# Patient Record
Sex: Female | Born: 1955 | Race: White | Hispanic: No | Marital: Married
Health system: Southern US, Community
[De-identification: ages and names within clinical notes are randomized; demographics above are authoritative.]

---

## 2005-01-13 ENCOUNTER — Emergency Department (HOSPITAL_COMMUNITY): Admission: EM | Admit: 2005-01-13 | Discharge: 2005-01-13 | Payer: Self-pay | Admitting: Emergency Medicine
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
## 2019-02-24 ENCOUNTER — Emergency Department (HOSPITAL_COMMUNITY)
Admission: EM | Admit: 2019-02-24 | Discharge: 2019-02-24 | Disposition: A | Discharge status: Home / Self Care | Attending: Emergency Medicine | Admitting: Emergency Medicine

## 2019-02-24 ENCOUNTER — Other Ambulatory Visit: Payer: Self-pay

## 2019-02-24 DIAGNOSIS — R21 Rash and other nonspecific skin eruption: Secondary | ICD-10-CM

## 2019-02-24 DIAGNOSIS — Z79899 Other long term (current) drug therapy: Secondary | ICD-10-CM | POA: Insufficient documentation

## 2019-02-24 DIAGNOSIS — L509 Urticaria, unspecified: Secondary | ICD-10-CM | POA: Diagnosis not present

## 2019-02-24 MED ORDER — DIPHENHYDRAMINE HCL 25 MG PO TABS: 25.0000 mg | ORAL_TABLET | Freq: Four times a day (QID) | ORAL | 0 refills | Status: AC | PRN

## 2019-02-24 MED ORDER — DOXYCYCLINE HYCLATE 100 MG PO CAPS: 100.0000 mg | ORAL_CAPSULE | Freq: Two times a day (BID) | ORAL | 0 refills | Status: AC

## 2019-02-24 MED ORDER — PREDNISONE 20 MG PO TABS
60.0000 mg | ORAL_TABLET | Freq: Once | ORAL | Status: AC
  Administered 2019-02-24: 60 mg via ORAL
  Filled 2019-02-24: qty 3

## 2019-02-24 MED ORDER — PREDNISONE 20 MG PO TABS: ORAL_TABLET | ORAL | 0 refills | Status: AC

## 2019-02-24 MED ORDER — DIPHENHYDRAMINE HCL 25 MG PO CAPS
25.0000 mg | ORAL_CAPSULE | Freq: Once | ORAL | Status: AC
  Administered 2019-02-24: 25 mg via ORAL
  Filled 2019-02-24: qty 1

## 2019-02-24 NOTE — ED Provider Notes (Signed)
[facility] EMERGENCY [facility]MENT Provider Note   CSN: [id number] Arrival date & time: 02/24/19  2140     History   Chief Complaint Chief Complaint  Patient presents with  . Rash    HPI Megan Perry is a 63 y.o. female.     The history is provided by the patient and medical records.  Rash    63 year old female presenting to the ED with rash.  States she primarily lives in [place] but recently relocated to [place] after inheriting a home in mid a[place]  Issues include throat today shopping and started feeling itchy throughout the day.  States this evening while changing clothes she noticed rash on her chest, stomach, and back.  States she called the nurse hotline for her insurance who told her to come to the ED.  She denies any fever, chills, or shortness of breath.  She has not had any changes in soaps, detergents, or other personal care products.  She has no known allergies.  She was started on meloxicam for arthritis in her back about 2 weeks ago.  She believes she has taken this in the past without issue but cannot fully recall.  She did find a tick on her leg about 4 weeks ago so was concerned about possible tickborne illness as well.  States previous tick bites have been treated with antibiotics.  She has not had any abdominal pain, fever, joint pain, headaches.  She was also weeding in her yard yesterday.  No meds PTA.  No past medical history on file.  There are no active problems to display for this patient.    OB History   No obstetric history on file.      Home Medications    Prior to Admission medications   Not on File    Family History No family history on file.  Social History Social History   Tobacco Use  . Smoking status: Not on file  Substance Use Topics  . Alcohol use: Not on file  . Drug use: Not on file     Allergies   Patient has no allergy information on record.   Review of Systems Review of Systems  Skin:  Positive for rash.  All other systems reviewed and are negative.    Physical Exam Updated Vital Signs BP (!) 165/98 (BP Location: Right Arm)   Pulse 65   Temp 97.9 F (36.6 C) (Oral)   Resp 16   SpO2 98%   Physical Exam Vitals signs and nursing note reviewed.  Constitutional:      Appearance: She is well-developed.  HENT:     Head: Normocephalic and atraumatic.     Mouth/Throat:     Comments: No oral lesions, airway clear Eyes:     Conjunctiva/sclera: Conjunctivae normal.     Pupils: Pupils are equal, round, and reactive to light.  Neck:     Musculoskeletal: Normal range of motion.  Cardiovascular:     Rate and Rhythm: Normal rate and regular rhythm.     Heart sounds: Normal heart sounds.  Pulmonary:     Effort: Pulmonary effort is normal.     Breath sounds: Normal breath sounds.     Comments: Lungs clear, no distress, speaking in paragraphs without difficulty Abdominal:     General: Bowel sounds are normal.     Palpations: Abdomen is soft.  Musculoskeletal: Normal range of motion.  Skin:    General: Skin is warm and dry.     Findings:  Rash present. Rash is urticarial.     Comments: Urticarial rash present on the stomach, chest, and back, some signs of excoriation noted but no signs of superimposed infection or cellulitis, there is no drainage, no lesions on the palms or soles No bull's-eye type rash  Neurological:     Mental Status: She is alert and oriented to person, place, and time.      ED Treatments / Results  Labs (all labs ordered are listed, but only abnormal results are displayed) Labs Reviewed - No data to display  EKG    Radiology No results found.  Procedures Procedures (including critical care time)  Medications Ordered in ED Medications  predniSONE (DELTASONE) tablet 60 mg (60 mg Oral Given 02/24/19 2302)  diphenhydrAMINE (BENADRYL) capsule 25 mg (25 mg Oral Given 02/24/19 2301)     Initial Impression / Assessment and Plan / ED Course   I have reviewed the triage vital signs and the nursing notes.  Pertinent labs & imaging results that were available during my care of the patient were reviewed by me and considered in my medical decision making (see chart for details).  63 year old female here with a rash that she noticed today.  She has recently relocated from [place] to [place].  She also reports a tick on her leg about 4 weeks ago but was not treated with antibiotics this go around as previously with tick bites.  She also reports starting meloxicam 2 weeks ago.  No changes in soaps, detergents, or other personal care products.  She is afebrile and nontoxic.  Urticarial rash across the stomach, chest, and back.  She has excoriation noted but no superimposed infection or cellulitis.  She has not had any lesions on the palms or soles, no oral lesions or airway compromise.  She does not have any bull's-eye type rash.  She is not having any fevers, headaches, excessive joint pain, etc. I discussed with patient that this seems more like an allergic type reaction rather than tickborne illness.  Multiple possible etiologies for this including environmental changes from recent move, new medications, or possible outdoor exposure as she was weeding her yard yesterday.  Would recommend treatment with prednisone and Benadryl.  Patient still has some concerns for tickborne illness as she reports she has a neighbor with Lyme disease.  I have given her prescription for doxycycline but do not feel she needs to fill this at this time.  I recommended that she talk with her primary care doctor about her meloxicam as this is possible etiology.  She can return here for any new or acute changes.  Final Clinical Impressions(s) / ED Diagnoses   Final diagnoses:  Rash    ED Discharge Orders         Ordered    predniSONE (DELTASONE) 20 MG tablet     02/24/19 2259    diphenhydrAMINE (BENADRYL) 25 MG tablet  Every 6 hours PRN     02/24/19 2259     doxycycline (VIBRAMYCIN) 100 MG capsule  2 times daily     02/24/19 2302           Larene Pickett, PA-C 02/24/19 2346    Lennice Sites, DO 02/25/19 0006

## 2019-02-24 NOTE — ED Triage Notes (Signed)
Pt reports red rash over her abd, chest and back that she noticed this morning. Denies any new soaps, lotions or detergents. Did start taking meloxicam 2 weeks ago but unsure if it is from the medicine. Pt also reports finding a tick on her 4 weeks ago in which her PCP gave her an abx for.

## 2019-02-24 NOTE — Discharge Instructions (Addendum)
Your rash seems more allergic than tickborne illness.  Take the prednisone and Benadryl as instructed.  You can use topical hydrocortisone cream as well if you like.  Benadryl can make you sleepy so use with caution or take at night time. Hold onto the prescription for doxycycline.  Again, I do not think this is a tickborne illness but keep this on hand should rash change or symptoms evolve. I will talk with your doctor about your meloxicam as this is a possible etiology of rash. Return here for any new or acute changes.

## 2019-02-24 NOTE — ED Notes (Signed)
Patient verbalizes understanding of discharge instructions. Opportunity for questioning and answers were provided. Armband removed by staff, pt discharged from ED.  

## 2020-02-07 ENCOUNTER — Ambulatory Visit: Attending: Sports Medicine | Admitting: Physical Therapy

## 2020-02-07 ENCOUNTER — Other Ambulatory Visit: Payer: Self-pay

## 2020-02-07 ENCOUNTER — Encounter: Payer: Self-pay | Admitting: Physical Therapy

## 2020-02-07 DIAGNOSIS — M25551 Pain in right hip: Secondary | ICD-10-CM

## 2020-02-07 DIAGNOSIS — M6281 Muscle weakness (generalized): Secondary | ICD-10-CM | POA: Diagnosis present

## 2020-02-07 NOTE — Therapy (Signed)
[facility] [address] [place], [place], [address] Phone: [phone]   Fax:  [phone]  Physical Therapy Evaluation  Patient Details  Name: Megan Perry MRN: [id number] Date of Birth: [birth year] Referring Provider (PT): Rennis Petty, MD   Encounter Date: 02/07/2020   PT End of Session - 02/07/20 1312    Visit Number 1    Number of Visits 12    Date for PT Re-Evaluation 03/27/20    PT Start Time 0815    PT Stop Time 0900    PT Time Calculation (min) 45 min    Activity Tolerance Patient tolerated treatment well           History reviewed. No pertinent past medical history.  History reviewed. No pertinent surgical history.  There were no vitals filed for this visit.    Subjective Assessment - 02/07/20 1302    Subjective COVID-19 screening performed upon arrival. Patient arrives to physical therapy with reports of right lateral hip pain that began in March 2021. Patient reported getting an injection in R hip to which it helped improve pain but still with intermittent shooting pain particularly when laying on the R side while sleeping. Patient reports ability to perform ADLs independently and has been performing Pilates exercises daily for about 15 mins but with decreased tension. Patient reports pain at worst as 7/10 and pain at best as 2/10. Patient's goals are to decrease pain, improve movement, and learn exercises to improve R hip strength.    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 Yes    Pain Score 2     Pain Location Hip    Pain Orientation Right    Pain Descriptors / Indicators Shooting    Pain Type Acute pain    Pain Onset More than a month ago    Pain Frequency Intermittent    Aggravating Factors   rolling over on it at night              [facility] PT Assessment - 02/07/20 0001      Assessment   Medical Diagnosis Trochanteric bursitis Right hip    Referring Provider (PT) Rennis Petty, MD    Onset Date/Surgical Date --   March 2021   Next MD Visit March 04, 2020    Prior Therapy no      Precautions   Precautions None      Restrictions   Weight Bearing Restrictions No      Balance Screen   Has the patient fallen in the past 6 months No    Has the patient had a decrease in activity level because of a fear of falling?  No    Is the patient reluctant to leave their home because of a fear of falling?  No      Home Environment   Living Environment Private residence      ROM / Strength   AROM / PROM / Strength AROM;Strength      AROM   AROM Assessment Site Hip    Right/Left Hip Right    Right Hip Flexion 112    Right Hip External Rotation  30    Right Hip Internal Rotation  10    Right Hip ABduction 20    Right Hip  ADduction 15      Strength   Strength Assessment Site Hip;Knee    Right/Left Hip Right    Right Hip Flexion 3-/5    Right Hip Extension 3-/5    Right Hip ABduction 3-/5    Right/Left Knee Right    Right Knee Flexion 4-/5    Right Knee Extension 4-/5      Palpation   Palpation comment tenderness to R greater trochanter, lateral glute, and ITB      Ambulation/Gait   Gait Pattern Step-through pattern;Decreased stance time - right;Decreased weight shift to right;Trendelenburg                      Objective measurements completed on examination: See above findings.               PT Education - 02/07/20 1311    Education Details bridge, clamshell, prone hip extension, piriformis stretch    Person(s) Educated Patient    Methods Explanation;Demonstration;Handout    Comprehension Verbalized understanding               PT Long Term Goals - 02/07/20 1314      PT LONG TERM GOAL #1   Title Patient will be independent with  HEP    Time 6    Period Weeks    Status New      PT LONG TERM GOAL #2   Title Patient will demonstrate 4+/5 or greater right LE MMT to improve stability during functional tasks.    Time 6    Period Weeks    Status New      PT LONG TERM GOAL #3   Title Patient will report ability to return to pilates exercise with no right hip pain.    Time 6    Period Weeks    Status New                  Plan - 02/07/20 1312    Clinical Impression Statement Patient is a 64 year old female who presents to physical therapy with R hip pain, and decreased R LE MMT that began about 3 months ago. Patient very tender to R greater trochanter and R ITB upon palpation; minimal in upper or middles glutes. Patient ambulates with slight R tendelenburg gait, and decreased R stance time, gait deviations more noticeable after end of evaluation. Patient and PT discussed plan of care and HEP to which she reported understanding. Patient would benefit from skilled physical therapy to address deficits and goals.    Stability/Clinical Decision Making Stable/Uncomplicated    Clinical Decision Making Low    Rehab Potential Good    PT Frequency 2x / week    PT Duration 6 weeks    PT Treatment/Interventions ADLs/Self Care Home Management;Cryotherapy;Electrical Stimulation;Iontophoresis 4mg /ml Dexamethasone;Moist Heat;Ultrasound;Gait training;Stair training;Functional mobility training;Therapeutic activities;Therapeutic exercise;Balance training;Neuromuscular re-education;Manual techniques;Passive range of motion;Patient/family education    PT Next Visit Plan nustep or bike, R LE strengthening, in supine then progress to standing. modlaities PRN for pain relief    PT Home Exercise Plan see patient education section    Consulted and Agree with Plan of Care Patient           Patient will benefit from skilled therapeutic intervention in order to improve the following deficits and impairments:  Decreased range of motion,  Decreased activity tolerance, Decreased strength, Pain  Visit Diagnosis: Pain in right hip  Muscle weakness (generalized)     Problem List There are  no problems to display for this patient.   Guss Bunde PT, DPT 02/07/2020, 1:21 PM  [facility] [address] [place], [place], [address] Phone: [phone]   Fax:  [phone]  Name: Megan Perry MRN: [id number] Date of Birth: 03-25-1956

## 2020-02-10 ENCOUNTER — Other Ambulatory Visit: Payer: Self-pay

## 2020-02-10 ENCOUNTER — Ambulatory Visit

## 2020-02-10 DIAGNOSIS — M25551 Pain in right hip: Secondary | ICD-10-CM

## 2020-02-10 DIAGNOSIS — M6281 Muscle weakness (generalized): Secondary | ICD-10-CM

## 2020-02-10 NOTE — Therapy (Signed)
[facility] [place], [place], [address] Phone: [phone]   Fax:  [phone]  Physical Therapy Treatment  Patient Details  Name: Storey Stangeland MRN: [id number] Date of Birth: [birth year] Referring Provider (PT): Janace Aris, MD   Encounter Date: 02/10/2020   PT End of Session - 02/10/20 0919    Visit Number 2    Number of Visits 12    Date for PT Re-Evaluation 03/27/20    PT Start Time 0901    PT Stop Time 0944    PT Time Calculation (min) 43 min    Activity Tolerance Patient tolerated treatment well    Behavior During Therapy [facility] for tasks assessed/performed           History reviewed. No pertinent past medical history.  History reviewed. No pertinent surgical history.  There were no vitals filed for this visit.   Subjective Assessment - 02/10/20 0908    Subjective COVID-19 screening performed upon arrival.  Pt stated her hip is feeling okay today, reports the shot assisted with some pain but feels its coming back.  Has began the exercises at home.    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 Yes    Pain Score 1     Pain Location Hip    Pain Orientation Right    Pain Descriptors / Indicators Aching    Pain Type Acute pain    Pain Radiating Towards lateral hip ending mid thigh doesnt reach knee    Pain Onset More than a month ago    Pain Frequency Intermittent    Aggravating Factors  sitting, rolling over on it at night    Pain Relieving Factors standing and walking                             [facility] Adult PT Treatment/Exercise - 02/10/20 0001      Exercises   Exercises Knee/Hip      Knee/Hip Exercises: Stretches   Piriformis Stretch Both;3 reps;30 seconds    Piriformis Stretch Limitations figure 4      Knee/Hip Exercises: Supine   Bridges 10 reps    Bridges Limitations 3" holds      Knee/Hip Exercises: Sidelying   Clams BLE  10x 3" GTB  around  thigh, cueing  not to roll back      Knee/Hip Exercises: Prone   Hip Extension Both;10 reps      Manual Therapy   Manual Therapy Soft tissue mobilization    Manual therapy comments Manual complete separate than rest of tx    Soft tissue mobilization Lt sidelying to Rt gluteal region                       PT Long Term Goals - 02/07/20 1314      PT LONG TERM GOAL #1   Title Patient will be independent with HEP    Time 6    Period Weeks    Status New      PT LONG TERM GOAL #2   Title Patient will demonstrate 4+/5 or greater right LE MMT to improve stability during functional tasks.    Time 6    Period Weeks    Status New      PT LONG TERM GOAL #3   Title Patient will report ability to return to pilates exercise with no right hip pain.  Time 6    Period Weeks    Status New                 Plan - 02/10/20 0930    Clinical Impression Statement Reviewed goals, educated importance of HEP compliance and assured correct form/mechanics with current home exercise program.  Pt able to recall and demonstrate appropriate mechanics iwht minimal cueing for form, able to complete all exercises with no reports of pain.  Modified piriformis stretch with reports of better stretch.  Manual STM complete Rt hip with tenderness with palpation, reports of relief following.    Stability/Clinical Decision Making Stable/Uncomplicated    Clinical Decision Making Low    Rehab Potential Good    PT Frequency 2x / week    PT Duration 6 weeks    PT Treatment/Interventions ADLs/Self Care Home Management;Cryotherapy;Electrical Stimulation;Iontophoresis 4mg /ml Dexamethasone;Moist Heat;Ultrasound;Gait training;Stair training;Functional mobility training;Therapeutic activities;Therapeutic exercise;Balance training;Neuromuscular re-education;Manual techniques;Passive range of motion;Patient/family education    PT Next Visit Plan nustep or bike, R LE strengthening, in supine then  progress to standing. modlaities PRN for pain relief    PT Home Exercise Plan see patient education section           Patient will benefit from skilled therapeutic intervention in order to improve the following deficits and impairments:  Decreased range of motion, Decreased activity tolerance, Decreased strength, Pain  Visit Diagnosis: Muscle weakness (generalized)  Pain in right hip     Problem List There are no problems to display for this patient.  , LPTA/CLT; CBIS [phone]' [phone] 02/10/2020, 1:05 PM  [facility] [address] [place], [place], [place] Phone: [phone]   Fax:  [phone]  Name: Shakaya Bhullar MRN: Herbert Moors Date of Birth: 08/03/1956

## 2020-02-10 NOTE — Patient Instructions (Signed)
Piriformis Stretch, Supine    Lie supine, one ankle crossed onto opposite knee. Holding bottom leg behind knee, gently pull legs toward chest until stretch is felt in buttock of top leg. Hold 30 seconds. For deeper stretch gently push top knee away from body.  Repeat 3 times per session. Do 2 sessions per day.  Copyright  VHI. All rights reserved.   

## 2020-02-14 ENCOUNTER — Other Ambulatory Visit: Payer: Self-pay

## 2020-02-14 ENCOUNTER — Ambulatory Visit: Admitting: Physical Therapy

## 2020-02-14 ENCOUNTER — Encounter: Payer: Self-pay | Admitting: Physical Therapy

## 2020-02-14 DIAGNOSIS — M25551 Pain in right hip: Secondary | ICD-10-CM

## 2020-02-14 DIAGNOSIS — M6281 Muscle weakness (generalized): Secondary | ICD-10-CM

## 2020-02-14 NOTE — Therapy (Signed)
[facility] [place], [place], [address] Phone: [phone]   Fax:  [phone]  Physical Therapy Treatment  Patient Details  Name: Tanessa Tidd MRN: [id number] Date of Birth: 08/20/1956 Referring Provider (PT): Janace Aris, MD   Encounter Date: 02/14/2020   PT End of Session - 02/14/20 0908    Visit Number 3    Number of Visits 12    Date for PT Re-Evaluation 03/27/20    PT Start Time 0902    PT Stop Time 0945    PT Time Calculation (min) 43 min    Activity Tolerance Patient tolerated treatment well    Behavior During Therapy [facility] for tasks assessed/performed           History reviewed. No pertinent past medical history.  History reviewed. No pertinent surgical history.  There were no vitals filed for this visit.   Subjective Assessment - 02/14/20 0907    Subjective COVID 19 screening performed on patient upon arrival. 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 No/denies              [facility] PT Assessment - 02/14/20 0001      Assessment   Medical Diagnosis Trochanteric bursitis Right hip    Referring Provider (PT) Janace Aris, MD    Next MD Visit March 04, 2020    Prior Therapy no      Precautions   Precautions None      Restrictions   Weight Bearing Restrictions No                         [facility] Adult PT Treatment/Exercise - 02/14/20 0001      Knee/Hip Exercises: Stretches   Passive Hamstring Stretch Right;3 reps;30 seconds    ITB Stretch Right;3 reps;30 seconds    Piriformis Stretch Right;3 reps;30 seconds    Piriformis Stretch Limitations figure 4      Knee/Hip Exercises: Aerobic    Nustep L3 x10 min      Knee/Hip Exercises: Supine   Hip Adduction Isometric Strengthening;Both;20 reps    Bridges with Clamshell Strengthening;Both;20 reps;Limitations   red theraband   Other Supine Knee/Hip Exercises B hip clam red theraband x20 rep      Knee/Hip Exercises: Sidelying   Clams RLE in L SL x20 reps      Manual Therapy   Manual Therapy Soft tissue mobilization    Soft tissue mobilization STW/TPR to R ITB, glute med to reduce TPs and resulting muscle tightness                       PT Long Term Goals - 02/07/20 1314      PT LONG TERM GOAL #1   Title Patient will be independent with HEP    Time 6    Period Weeks    Status New      PT LONG TERM GOAL #2   Title Patient will demonstrate 4+/5 or greater right LE MMT to improve stability during functional tasks.    Time 6    Period Weeks    Status New  PT LONG TERM GOAL #3   Title Patient will report ability to return to pilates exercise with no right hip pain.    Time 6    Period Weeks    Status New                 Plan - 02/14/20 1023    Clinical Impression Statement Patient presented in clinic with reports of pain initially upon waking this morning. Patient remains faithful to pilates and HEP provided in PT. Chronic R knee pain somewhat limited PT session with figure 4 stretching. Patient notes that during activities pain is not bothersome but afterwards she experiences increased pain. TPs noted in R ITB region.    Stability/Clinical Decision Making Stable/Uncomplicated    Rehab Potential Good    PT Frequency 2x / week    PT Duration 6 weeks    PT Treatment/Interventions ADLs/Self Care Home Management;Cryotherapy;Electrical Stimulation;Iontophoresis 4mg /ml Dexamethasone;Moist Heat;Ultrasound;Gait training;Stair training;Functional mobility training;Therapeutic activities;Therapeutic exercise;Balance training;Neuromuscular re-education;Manual techniques;Passive range of  motion;Patient/family education    PT Next Visit Plan nustep or bike, R LE strengthening, in supine then progress to standing. modlaities PRN for pain relief    PT Home Exercise Plan see patient education section    Consulted and Agree with Plan of Care Patient           Patient will benefit from skilled therapeutic intervention in order to improve the following deficits and impairments:  Decreased range of motion, Decreased activity tolerance, Decreased strength, Pain  Visit Diagnosis: Muscle weakness (generalized)  Pain in right hip     Problem List There are no problems to display for this patient.   , PTA 02/14/2020, 11:28 AM  [facility] [address] [place], [place], [place] Phone: [phone]   Fax:  [phone]  Name: Vail Basista MRN: Herbert Moors Date of Birth: [birth year]

## 2020-02-17 ENCOUNTER — Ambulatory Visit: Admitting: Physical Therapy

## 2020-02-17 ENCOUNTER — Other Ambulatory Visit: Payer: Self-pay

## 2020-02-17 DIAGNOSIS — M25551 Pain in right hip: Secondary | ICD-10-CM | POA: Diagnosis not present

## 2020-02-17 DIAGNOSIS — M6281 Muscle weakness (generalized): Secondary | ICD-10-CM

## 2020-02-17 NOTE — Therapy (Signed)
[facility] [place], [place], [address] Phone: [phone]   Fax:  [phone]  Physical Therapy Treatment  Patient Details  Name: Megan Perry MRN: [id number] Date of Birth: Jan 21, 1956 Referring Provider (PT): Janace Aris, MD   Encounter Date: 02/17/2020   PT End of Session - 02/17/20 0900    Visit Number 4    Number of Visits 12    Date for PT Re-Evaluation 03/27/20    PT Start Time 0815    PT Stop Time 0859    PT Time Calculation (min) 44 min    Activity Tolerance Patient tolerated treatment well    Behavior During Therapy [facility] for tasks assessed/performed           No past medical history on file.  No past surgical history on file.  There were no vitals filed for this visit.   Subjective Assessment - 02/17/20 0824    Subjective COVID 19 screening performed on patient upon arrival. Patient arrived with less discomfort overall, did well after last treatment.    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 Yes    Pain Score 2     Pain Location Hip    Pain Orientation Right    Pain Descriptors / Indicators Discomfort    Pain Type Acute pain    Pain Onset More than a month ago    Pain Frequency Intermittent    Aggravating Factors  prolong sitting or pressure on hip    Pain Relieving Factors standing / walking                             [facility] Adult PT Treatment/Exercise - 02/17/20 0001      Knee/Hip Exercises: Stretches   ITB Stretch Right;3 reps;20 seconds   standing     Knee/Hip Exercises: Aerobic   Nustep L3 x10 min      Knee/Hip Exercises: Supine   Other Supine Knee/Hip Exercises B hip clam red theraband x20 rep      Modalities   Modalities Ultrasound      Ultrasound    Ultrasound Location rt hip    Ultrasound Parameters 1.5w/cm2/50%/61mhz x59min    Ultrasound Goals Pain      Manual Therapy   Manual Therapy Soft tissue mobilization    Soft tissue mobilization STW/TPR to R ITB, piriformis glute med to reduce TPs and resulting muscle tightness      Prosthetics   Prosthetic Care Comments                          PT Long Term Goals - 02/17/20 1540      PT LONG TERM GOAL #1   Title Patient will be independent with HEP    Time 6    Period Weeks    Status On-going      PT LONG TERM GOAL #2   Title Patient will demonstrate 4+/5 or greater right LE MMT to improve stability during functional tasks.    Time 6    Period Weeks    Status On-going      PT LONG TERM GOAL #3   Title Patient will report ability to return to pilates  exercise with no right hip pain.    Time 6    Period Weeks    Status On-going                 Plan - 02/17/20 0903    Clinical Impression Statement Patient tolerated treatment well today. Patient continues to have some pain in right hip esp with laying on right side or prolong activity. Patient is limited with right knee pain, so no progression on nustep today. Today started [place] to hip followed by manual STW to hip area to reduce pain and tightness. Patient has palpable pain and tightness with manual STW today. Patient current goals progressing.    Stability/Clinical Decision Making Stable/Uncomplicated    Rehab Potential Good    PT Frequency 2x / week    PT Duration 6 weeks    PT Treatment/Interventions ADLs/Self Care Home Management;Cryotherapy;Electrical Stimulation;Iontophoresis 4mg /ml Dexamethasone;Moist Heat;Ultrasound;Gait training;Stair training;Functional mobility training;Therapeutic activities;Therapeutic exercise;Balance training;Neuromuscular re-education;Manual techniques;Passive range of motion;Patient/family education    PT Next Visit Plan assess and cont with R LE strengthening, in supine  then progress to standing. modlaities PRN for pain relief awaiting for ionto approval from MD    Consulted and Agree with Plan of Care Patient           Patient will benefit from skilled therapeutic intervention in order to improve the following deficits and impairments:  Decreased range of motion, Decreased activity tolerance, Decreased strength, Pain  Visit Diagnosis: Muscle weakness (generalized)  Pain in right hip     Problem List There are no problems to display for this patient.   [place], PTA 02/17/2020, 9:14 AM  [facility] [address] [place], [place], [place] Phone: [phone]   Fax:  [phone]  Name: Megan Perry MRN: Herbert Moors Date of Birth: 1956-05-24

## 2020-02-18 NOTE — Addendum Note (Signed)
Addended by: Guss Bunde on: 02/18/2020 03:24 PM   Modules accepted: Orders

## 2020-02-20 ENCOUNTER — Encounter: Payer: Self-pay | Admitting: Physical Therapy

## 2020-02-20 ENCOUNTER — Other Ambulatory Visit: Payer: Self-pay

## 2020-02-20 ENCOUNTER — Ambulatory Visit: Admitting: Physical Therapy

## 2020-02-20 DIAGNOSIS — M25551 Pain in right hip: Secondary | ICD-10-CM | POA: Diagnosis not present

## 2020-02-20 DIAGNOSIS — M6281 Muscle weakness (generalized): Secondary | ICD-10-CM

## 2020-02-20 NOTE — Therapy (Signed)
[facility] [place], [place], [address] Phone: [phone]   Fax:  [phone]  Physical Therapy Treatment  Patient Details  Name: Megan Perry MRN: [id number] Date of Birth: May 02, 1956 Referring Provider (PT): Janace Aris, MD   Encounter Date: 02/20/2020   PT End of Session - 02/20/20 0826    Visit Number 5    Number of Visits 12    Date for PT Re-Evaluation 03/27/20    PT Start Time 0815    PT Stop Time 0856    PT Time Calculation (min) 41 min    Activity Tolerance Patient tolerated treatment well    Behavior During Therapy [facility] for tasks assessed/performed           History reviewed. No pertinent past medical history.  History reviewed. No pertinent surgical history.  There were no vitals filed for this visit.   Subjective Assessment - 02/20/20 0816    Subjective COVID 19 screening performed on patient upon arrival. Patient reported soreness then improvement after last treatment.    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 Yes    Pain Score 2     Pain Location Hip    Pain Orientation Right    Pain Descriptors / Indicators Discomfort    Pain Type Acute pain    Pain Onset More than a month ago    Pain Frequency Intermittent    Aggravating Factors  pressure on hip    Pain Relieving Factors walking/standing                             [facility] Adult PT Treatment/Exercise - 02/20/20 0001      Knee/Hip Exercises: Aerobic   Nustep L3 x10 min      Ultrasound   Ultrasound Location RT hip    Ultrasound Parameters 1.5w/cm2/50%/76mhz x44min    Ultrasound Goals Pain      Manual Therapy   Manual Therapy Soft tissue mobilization    Soft tissue mobilization STW/TPR to R ITB,  piriformis glute med to reduce TPs and resulting muscle tightness                       PT Long Term Goals - 02/17/20 0903      PT LONG TERM GOAL #1   Title Patient will be independent with HEP    Time 6    Period Weeks    Status On-going      PT LONG TERM GOAL #2   Title Patient will demonstrate 4+/5 or greater right LE MMT to improve stability during functional tasks.    Time 6    Period Weeks    Status On-going      PT LONG TERM GOAL #3   Title Patient will report ability to return to pilates exercise with no right hip pain.    Time 6    Period Weeks    Status On-going                 Plan - 02/20/20 0859    Clinical Impression Statement Patient tolerated treatment well today. Patient reported some soreness then relief at end of day last session. Patient reported doing a lot  of yard work yesterday and she is doing HEP stretches and exercises daily. Today focused on gentle activity on nustep followed by [place] and STW to reduce pain and inflammation of hip. Patient continues to have palpable pain in right hip with STW. Goals progressing.    Stability/Clinical Decision Making Stable/Uncomplicated    Rehab Potential Good    PT Frequency 2x / week    PT Duration 6 weeks    PT Treatment/Interventions ADLs/Self Care Home Management;Cryotherapy;Electrical Stimulation;Iontophoresis 4mg /ml Dexamethasone;Moist Heat;Ultrasound;Gait training;Stair training;Functional mobility training;Therapeutic activities;Therapeutic exercise;Balance training;Neuromuscular re-education;Manual techniques;Passive range of motion;Patient/family education    PT Next Visit Plan cont and cont with R LE strengthening, in supine then progress to standing. modlaities PRN for pain relief awaiting for ionto approval from MD    Consulted and Agree with Plan of Care Patient           Patient will benefit from skilled therapeutic intervention in order to improve the following deficits and  impairments:  Decreased range of motion, Decreased activity tolerance, Decreased strength, Pain  Visit Diagnosis: Muscle weakness (generalized)  Pain in right hip     Problem List There are no problems to display for this patient.   [place], PTA 02/20/2020, 9:05 AM  [facility] [address] [place], [place], [place] Phone: [phone]   Fax:  [phone]  Name: Megan Perry MRN: Herbert Moors Date of Birth: [birth year]

## 2020-02-24 ENCOUNTER — Encounter: Payer: Self-pay | Admitting: Physical Therapy

## 2020-02-24 ENCOUNTER — Other Ambulatory Visit: Payer: Self-pay

## 2020-02-24 ENCOUNTER — Ambulatory Visit: Admitting: Physical Therapy

## 2020-02-24 DIAGNOSIS — M25551 Pain in right hip: Secondary | ICD-10-CM | POA: Diagnosis not present

## 2020-02-24 DIAGNOSIS — M6281 Muscle weakness (generalized): Secondary | ICD-10-CM

## 2020-02-24 NOTE — Therapy (Signed)
[facility] [place], [place], [address] Phone: [phone]   Fax:  [phone]  Physical Therapy Treatment  Patient Details  Name: Megan Perry MRN: [id number] Date of Birth: [birth year] Referring Provider (PT): Janace Aris, MD   Encounter Date: 02/24/2020   PT End of Session - 02/24/20 0908    Visit Number 6    Number of Visits 12    Date for PT Re-Evaluation 03/27/20    PT Start Time 0902    PT Stop Time 0946    PT Time Calculation (min) 44 min    Activity Tolerance Patient tolerated treatment well    Behavior During Therapy [facility] for tasks assessed/performed           History reviewed. No pertinent past medical history.  History reviewed. No pertinent surgical history.  There were no vitals filed for this visit.   Subjective Assessment - 02/24/20 0906    Subjective COVID 19 screening performed on patient upon arrival. Patient reports pain is still in hip but much better. Painfree primarily at rest.    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 No/denies              [facility] PT Assessment - 02/24/20 0001      Assessment   Medical Diagnosis Trochanteric bursitis Right hip    Referring Provider (PT) Janace Aris, MD    Next MD Visit 03/04/2020    Prior Therapy no      Precautions   Precautions None      Restrictions   Weight Bearing Restrictions No                         [facility] Adult PT Treatment/Exercise - 02/24/20 0001      Knee/Hip Exercises: Aerobic   Nustep L2 x10 min      Modalities   Modalities Ultrasound      Ultrasound   Ultrasound Location R greater trochanter     Ultrasound Parameters 1.5 w/cm2, 100%, 1 mhz x10 min    Ultrasound Goals Pain      Manual  Therapy   Manual Therapy Soft tissue mobilization    Soft tissue mobilization STW/TPR to R ITB, piriformis glute med to reduce TPs and resulting muscle tightness                       PT Long Term Goals - 02/24/20 0912      PT LONG TERM GOAL #1   Title Patient will be independent with HEP    Time 6    Period Weeks    Status Achieved      PT LONG TERM GOAL #2   Title Patient will demonstrate 4+/5 or greater right LE MMT to improve stability during functional tasks.    Time 6    Period Weeks    Status On-going      PT LONG TERM GOAL #3   Title Patient will report ability to return to pilates exercise with no right hip pain.    Time 6    Period Weeks    Status Achieved                 Plan -  02/24/20 0959    Clinical Impression Statement Patient presented in clinic with only intermittant pain that is mostly during activity. Rest is predominately pain free per patient report. Patient indpendent with HEP and correcting any gait deviations. Patient continues to present with min to mod muscle tightness of the R ITB, glute region. Normal [place] response noted at end of [place] session. No negetive complaints reported by end of treatment session.    Stability/Clinical Decision Making Stable/Uncomplicated    Rehab Potential Good    PT Frequency 2x / week    PT Duration 6 weeks    PT Treatment/Interventions ADLs/Self Care Home Management;Cryotherapy;Electrical Stimulation;Iontophoresis 4mg /ml Dexamethasone;Moist Heat;Ultrasound;Gait training;Stair training;Functional mobility training;Therapeutic activities;Therapeutic exercise;Balance training;Neuromuscular re-education;Manual techniques;Passive range of motion;Patient/family education    PT Next Visit Plan cont and cont with R LE strengthening, in supine then progress to standing. modlaities PRN for pain relief awaiting for ionto approval from MD    PT Home Exercise Plan see patient education section    Consulted and Agree  with Plan of Care Patient           Patient will benefit from skilled therapeutic intervention in order to improve the following deficits and impairments:  Decreased range of motion, Decreased activity tolerance, Decreased strength, Pain  Visit Diagnosis: Muscle weakness (generalized)  Pain in right hip     Problem List There are no problems to display for this patient.   [place], PTA 02/24/2020, 10:02 AM  [facility] [address] [place], [place], [place] Phone: [phone]   Fax:  [phone]  Name: Cleta Heatley MRN: Herbert Moors Date of Birth: Jul 21, 1956

## 2020-02-28 ENCOUNTER — Ambulatory Visit: Attending: Sports Medicine | Admitting: Physical Therapy

## 2020-02-28 ENCOUNTER — Encounter: Payer: Self-pay | Admitting: Physical Therapy

## 2020-02-28 ENCOUNTER — Other Ambulatory Visit: Payer: Self-pay

## 2020-02-28 DIAGNOSIS — M6281 Muscle weakness (generalized): Secondary | ICD-10-CM | POA: Diagnosis not present

## 2020-02-28 DIAGNOSIS — M25551 Pain in right hip: Secondary | ICD-10-CM

## 2020-02-28 NOTE — Therapy (Signed)
[facility] [address] [place], [place], [address] Phone: [phone]   Fax:  [phone]  Physical Therapy Treatment  Patient Details  Name: Megan Perry MRN: [id number] Date of Birth: [birth year] Referring Provider (PT): Rennis Petty, MD   Encounter Date: 02/28/2020   PT End of Session - 02/28/20 1111    Visit Number 7    Number of Visits 12    Date for PT Re-Evaluation 03/27/20    PT Start Time 0902    PT Stop Time 0945    PT Time Calculation (min) 43 min    Activity Tolerance Patient tolerated treatment well    Behavior During Therapy [facility] for tasks assessed/performed           History reviewed. No pertinent past medical history.  History reviewed. No pertinent surgical history.  There were no vitals filed for this visit.   Subjective Assessment - 02/28/20 0909    Subjective COVID 19 screening performed on patient upon arrival. Patient reports that her hip is pretty good. Goes back for knee injection on 03/04/2020.    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 Other (Comment)   No pain assessment provided by patient             [facility] PT Assessment - 02/28/20 0001      Assessment   Medical Diagnosis Trochanteric bursitis Right hip    Referring Provider (PT) Rennis Petty, MD    Next MD Visit 03/04/2020    Prior Therapy no      Precautions   Precautions None      Restrictions   Weight Bearing Restrictions No                         [facility] Adult PT Treatment/Exercise - 02/28/20 0001      Knee/Hip Exercises: Aerobic   Nustep L4 x10 min      Modalities   Modalities Ultrasound      Ultrasound   Ultrasound Location R greater trochanter    Ultrasound Parameters Combo 1.5 w/cm2, 100%, 1  mhz x10 min    Ultrasound Goals Pain      Manual Therapy   Manual Therapy Soft tissue mobilization    Soft tissue mobilization STW/TPR to R ITB, piriformis glute med to reduce TPs and resulting muscle tightness                       PT Long Term Goals - 02/24/20 0912      PT LONG TERM GOAL #1   Title Patient will be independent with HEP    Time 6    Period Weeks    Status Achieved      PT LONG TERM GOAL #2   Title Patient will demonstrate 4+/5 or greater right LE MMT to improve stability during functional tasks.    Time 6    Period Weeks    Status On-going      PT LONG TERM GOAL #3   Title Patient will report ability to return to pilates exercise with no right hip pain.    Time 6    Period Weeks    Status Achieved  Plan - 02/28/20 1114    Clinical Impression Statement Patient presented in clinic with reports of some improvement of R hip pain but does have pain following activity while at rest. Patient compliant with icing and HEP as well as still being active with pilates. Patient reports 2-3/10 R hip pain with ADLs. Normal modalities response noted following removal of the modalities.    Stability/Clinical Decision Making Stable/Uncomplicated    Rehab Potential Good    PT Frequency 2x / week    PT Duration 6 weeks    PT Treatment/Interventions ADLs/Self Care Home Management;Cryotherapy;Electrical Stimulation;Iontophoresis 4mg /ml Dexamethasone;Moist Heat;Ultrasound;Gait training;Stair training;Functional mobility training;Therapeutic activities;Therapeutic exercise;Balance training;Neuromuscular re-education;Manual techniques;Passive range of motion;Patient/family education    PT Next Visit Plan cont and cont with R LE strengthening, in supine then progress to standing. modlaities PRN for pain relief awaiting for ionto approval from MD    PT Home Exercise Plan see patient education section    Consulted and Agree with Plan of Care Patient             Patient will benefit from skilled therapeutic intervention in order to improve the following deficits and impairments:  Decreased range of motion, Decreased activity tolerance, Decreased strength, Pain  Visit Diagnosis: Muscle weakness (generalized)  Pain in right hip     Problem List There are no problems to display for this patient.  Megan Perry 02/28/20 11:36 AM   [facility] [address] [place], [place], [place] Phone: [phone]   Fax:  [phone]  Name: [name] MRN: Herbert Moors Date of Birth: 1956-02-21

## 2020-03-17 ENCOUNTER — Ambulatory Visit: Admitting: Physical Therapy

## 2020-03-17 ENCOUNTER — Encounter: Payer: Self-pay | Admitting: Physical Therapy

## 2020-03-17 ENCOUNTER — Other Ambulatory Visit: Payer: Self-pay

## 2020-03-17 DIAGNOSIS — M6281 Muscle weakness (generalized): Secondary | ICD-10-CM

## 2020-03-17 DIAGNOSIS — M25551 Pain in right hip: Secondary | ICD-10-CM

## 2020-03-17 NOTE — Therapy (Signed)
[facility] [place], [place], [address] Phone: [phone]   Fax:  [phone]  Physical Therapy Treatment  PHYSICAL THERAPY DISCHARGE SUMMARY  Visits from Start of Care: 8  Current functional level related to goals / functional outcomes: See below   Remaining deficits: See goals   Education / Equipment: HEP Plan: Patient agrees to discharge.  Patient goals were met. Patient is being discharged due to meeting the stated rehab goals.  ?????  Gabriela Eves, PT, DPT   Patient Details  Name: Megan Perry MRN: [id number] Date of Birth: 1955-10-14 Referring Provider (PT): Janace Aris, MD   Encounter Date: 03/17/2020   PT End of Session - 03/17/20 0934    Visit Number 8    Number of Visits 12    Date for PT Re-Evaluation 03/27/20    PT Start Time 0900    PT Stop Time 0920    PT Time Calculation (min) 20 min    Activity Tolerance Patient tolerated treatment well    Behavior During Therapy [facility] for tasks assessed/performed           History reviewed. No pertinent past medical history.  History reviewed. No pertinent surgical history.  There were no vitals filed for this visit.   Subjective Assessment - 03/17/20 0933    Subjective COVID 19 screening performed on patient upon arrival. Patient reports no pain in the hip, and her goal of 0/10 hip pain has been achieved. Had an injection in her knee recently.    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 No/denies                                     PT Education - 03/17/20 0943    Education Details clamshell, bridge with clamshell, and bridge with pillow squeeze    Person(s) Educated Patient    Methods Explanation;Handout     Comprehension Verbalized understanding               PT Long Term Goals - 03/17/20 0941      PT LONG TERM GOAL #1   Title Patient will be independent with HEP    Time 6    Period Weeks    Status Achieved      PT LONG TERM GOAL #2   Title Patient will demonstrate 4+/5 or greater right LE MMT to improve stability during functional tasks.    Time 6    Period Weeks    Status Achieved      PT LONG TERM GOAL #3   Title Patient will report ability to return to pilates exercise with no right hip pain.    Time 6    Period Weeks    Status Achieved                 Plan - 03/17/20 0934    Clinical Impression Statement Patient presented to the clinic with no pain in the right hip. Attempted warm up on the nustep but wanted to discuss plan of care. Patient discussed she has achieved her goal of no pain in right hip. Patient and PT discussed DC today with emphasis on HEP. Patient inquired if  utilizing an elliptical would aggravate her hip as her neighbor is selling hers and would like to buy it. Patient educated to trial it for her response before purachasing as it has been a couple of years since she has used an elliptical. Patient and PT discussed advanced HEP and was provided with a handout and red and green therabands for progression. Patient also inquired about physical therapy for her knee more so for precaution and patient educated a referral would be needed. Patient reported understanding. Patient DC'd today with all goals met.    Stability/Clinical Decision Making Stable/Uncomplicated    Clinical Decision Making Low    Rehab Potential Good    PT Frequency 2x / week    PT Duration 6 weeks    PT Treatment/Interventions ADLs/Self Care Home Management;Cryotherapy;Electrical Stimulation;Iontophoresis 80m/ml Dexamethasone;Moist Heat;Ultrasound;Gait training;Stair training;Functional mobility training;Therapeutic activities;Therapeutic exercise;Balance training;Neuromuscular  re-education;Manual techniques;Passive range of motion;Patient/family education    PT Next Visit Plan DC    PT Home Exercise Plan see patient education section    Consulted and Agree with Plan of Care Patient           Patient will benefit from skilled therapeutic intervention in order to improve the following deficits and impairments:  Decreased range of motion, Decreased activity tolerance, Decreased strength, Pain  Visit Diagnosis: Muscle weakness (generalized)  Pain in right hip     Problem List There are no problems to display for this patient.   KGabriela Eves PT, DPT 03/17/2020, 9:44 AM  [facility] [address]Phone: [phone]  Fax:  [phone] Name: SAubrie LucienMRN: [id number] of Birth: [birth year]

## 2020-11-05 ENCOUNTER — Ambulatory Visit: Attending: Adult Reconstructive Orthopaedic Surgery | Admitting: Physical Therapy

## 2020-11-05 ENCOUNTER — Encounter: Payer: Self-pay | Admitting: Physical Therapy

## 2020-11-05 ENCOUNTER — Other Ambulatory Visit: Payer: Self-pay

## 2020-11-05 DIAGNOSIS — M6281 Muscle weakness (generalized): Secondary | ICD-10-CM | POA: Insufficient documentation

## 2020-11-05 DIAGNOSIS — M25562 Pain in left knee: Secondary | ICD-10-CM | POA: Insufficient documentation

## 2020-11-05 DIAGNOSIS — G8929 Other chronic pain: Secondary | ICD-10-CM | POA: Diagnosis present

## 2020-11-05 DIAGNOSIS — R600 Localized edema: Secondary | ICD-10-CM | POA: Diagnosis present

## 2020-11-05 DIAGNOSIS — M25561 Pain in right knee: Secondary | ICD-10-CM | POA: Diagnosis present

## 2020-11-05 NOTE — Therapy (Addendum)
[facility] [address] [place], [place], [address] Phone: [phone]   Fax:  [phone]  Physical Therapy Evaluation  Patient Details  Name: Megan Perry MRN: [id number] Date of Birth: 1956/06/04 Referring Provider (PT): Larry Sierras, MD   Encounter Date: 11/05/2020   PT End of Session - 11/05/20 1417    Visit Number 1    Number of Visits 6    Date for PT Re-Evaluation 12/04/20    Authorization Type [employer]; Progress note every 10th visit    PT Start Time 1030    PT Stop Time 1116    PT Time Calculation (min) 46 min    Activity Tolerance Patient limited by pain    Behavior During Therapy [facility] for tasks assessed/performed           History reviewed. No pertinent past medical history.  History reviewed. No pertinent surgical history.  There were no vitals filed for this visit.    Subjective Assessment - 11/05/20 1403    Subjective COVID-19 screening performed upon arrival. Patient arrives to physical therapy with chronic bilateral knee pain R worse than L that has progressed since 2013. Patient has had frequent injections to bilateral knees since 2013 as needed but reported the last injection to R knee did not give her much relief. Patient reported getting an injection to L knee on Feb 9 with good response. Patient reports difficulties with all activities especially walking and negotiating steps. Patient negotiates with a step to step pattern. Patient has intermittent shar pain in R knee with standing. Patient's pain at worst rated as 7/10 and pain at best as 0/10 but reports that occurs rarely. Patient reports she will eventually get a knee replacement but won't occur until her husband recovers from shoulder surgery. Patient's goals are to decrease pain, improve movement, improve strength, improve standing and walking tolerance, and improve ability to perform ADLs and home activities.    Pertinent History Gastroc recession 2013 &  2014    Limitations Standing;Walking;House hold activities    How long can you sit comfortably? will stiffen up after sitting for long periods    How long can you stand comfortably? short periods    How long can you walk comfortably? short periods    Diagnostic tests x-ray: arthritis    Patient Stated Goals decrease pain, build strength    Currently in Pain? Yes    Pain Score 4     Pain Location Knee    Pain Orientation Right;Left    Pain Descriptors / Indicators Sore;Aching    Pain Type Chronic pain    Pain Onset More than a month ago    Pain Frequency Constant    Aggravating Factors  walking    Pain Relieving Factors diclofenac, ice    Effect of Pain on Daily Activities "can't be outside long"              [facility] PT Assessment - 11/05/20 0001      Assessment   Medical Diagnosis Bilateral chronic knee pain    Referring Provider (PT) Larry Sierras, MD    Onset Date/Surgical Date --   2013   Next MD Visit to be made    Prior Therapy for hip      Precautions   Precautions None      Restrictions   Weight Bearing Restrictions No      Balance Screen   Has the patient fallen in the past 6 months No    Has  the patient had a decrease in activity level because of a fear of falling?  No    Is the patient reluctant to leave their home because of a fear of falling?  No      Home [employer] information [employer] residence    Living Arrangements Spouse/significant other    Type of Home House    Home Layout Laundry or work area in basement      Prior Function   Level of Independence Independent      ROM / Strength   AROM / PROM / Strength AROM;PROM;Strength      AROM   Overall AROM  Deficits;Due to pain    AROM Assessment Site Knee    Right/Left Knee Right;Left    Right Knee Extension 6    Right Knee Flexion 118    Left Knee Extension 0    Left Knee Flexion 138      PROM   PROM Assessment Site Knee    Right/Left Knee Right;Left    Right Knee Extension 4     Right Knee Flexion 122    Left Knee Extension 0    Left Knee Flexion 138      Strength   Overall Strength Deficits    Strength Assessment Site Knee;Hip    Right/Left Hip Left;Right    Right Hip Flexion 3+/5    Right Hip Extension 3+/5    Right Hip ABduction 3+/5    Left Hip Flexion 3+/5    Left Hip Extension 3+/5    Left Hip ABduction 3+/5    Right/Left Knee Right;Left    Right Knee Flexion 3+/5    Right Knee Extension 3+/5    Left Knee Flexion 4/5    Left Knee Extension 3+/5      Transfers   Five time sit to stand comments  18.59 seconds with UE support      Ambulation/Gait   Gait Pattern Step-through pattern;Decreased stride length;Decreased stance time - right;Decreased step length - left;Decreased hip/knee flexion - right;Decreased weight shift to right                      Objective measurements completed on examination: See above findings.               PT Education - 11/05/20 1415    Education Details LAQ,  hamstring set, SLR    Person(s) Educated Patient    Methods Explanation;Demonstration;Handout    Comprehension Verbalized understanding;Returned demonstration               PT Long Term Goals - 11/05/20 1720      PT LONG TERM GOAL #1   Title Patient will be independent with HEP    Time 6    Period Weeks    Status New      PT LONG TERM GOAL #2   Title Patient will demonstrate 4+/5 or greater bilateral LE MMT to improve stability during functional tasks.    Time 6    Period Weeks    Status New      PT LONG TERM GOAL #3   Title Patient will demosntrate 120+ degrees of right knee flexion AROM to improve functional tasks.    Time 6    Period Weeks    Status New      PT LONG TERM GOAL #4   Title Patient will negotiate steps reciprocally with one railing to safely access basement for washer/dryer.  Time 6    Period Weeks    Status New      PT LONG TERM GOAL #5   Title Patient will report ability to perform ADLs, home  activities, and pilates with bilateral knee pain less than or equal to 3/10.    Time 6    Period Weeks    Status New                  Plan - 11/05/20 1417    Clinical Impression Statement Patient is a 65 year old female who presents to physical therapy with reports of chronic bilateral knee pain, R>L, bilateral LE weakness, and difficulty walking. Patient noted with decreased bilateral patella mobility particularly in inferior and superior motions. Patient's 5x sit to stand time with UE support categorizes her as a fall risk with decreased functional LE strength. Patient and PT discussed plan of care and discussed HEP to which patient reported understanding. Patient would benefit from skilled physical therapy to address deficits and address patient's goals.    Personal Factors and Comorbidities Age;Comorbidity 1    Comorbidities bilateral knee OA    Examination-Activity Limitations Locomotion Level;Transfers;Stairs;Squat;Stand    Examination-Participation Restrictions Yard Work    Stability/Clinical Decision Making Stable/Uncomplicated    Clinical Decision Making Low    Rehab Potential Fair    PT Frequency 2x / week    PT Duration 6 weeks    PT Treatment/Interventions ADLs/Self Care Home Management;Cryotherapy;Electrical Stimulation;Gait training;Stair training;Functional mobility training;Therapeutic activities;Therapeutic exercise;Neuromuscular re-education;Manual techniques;Passive range of motion;Patient/family education;Vasopneumatic Device;Taping    PT Next Visit Plan nustep, knee strengthening and stretching, hip strengthening, modalities PRN for pain relief NO MODALITIES AFTER 15TH VISIT ([employer])    PT Home Exercise Plan see patient education section    Consulted and Agree with Plan of Care Patient           Patient will benefit from skilled therapeutic intervention in order to improve the following deficits and impairments:  Decreased range of motion,Difficulty  walking,Decreased activity tolerance,Decreased strength,Pain  Visit Diagnosis: Chronic pain of right knee - Plan: PT plan of care cert/re-cert  Chronic pain of left knee - Plan: PT plan of care cert/re-cert  Muscle weakness (generalized) - Plan: PT plan of care cert/re-cert  Localized edema - Plan: PT plan of care cert/re-cert     Problem List There are no problems to display for this patient.   Guss Bunde, PT, DPT 11/05/2020, 5:33 PM  [facility] [address] [place], [place], [address] Phone: [phone]   Fax:  [phone]  Name: Sharrie Self MRN: [id number] Date of Birth: 08-Jun-1956

## 2020-11-05 NOTE — Addendum Note (Signed)
Addended by: Guss Bunde on: 11/05/2020 05:33 PM   Modules accepted: Orders

## 2020-11-06 ENCOUNTER — Ambulatory Visit: Admitting: Physical Therapy

## 2020-11-06 ENCOUNTER — Other Ambulatory Visit: Payer: Self-pay

## 2020-11-06 DIAGNOSIS — M6281 Muscle weakness (generalized): Secondary | ICD-10-CM

## 2020-11-06 DIAGNOSIS — R600 Localized edema: Secondary | ICD-10-CM

## 2020-11-06 DIAGNOSIS — M25561 Pain in right knee: Secondary | ICD-10-CM | POA: Diagnosis not present

## 2020-11-06 DIAGNOSIS — G8929 Other chronic pain: Secondary | ICD-10-CM

## 2020-11-06 NOTE — Therapy (Signed)
[facility] [address] [place], [place], [address] Phone: [phone]   Fax:  [phone]  Physical Therapy Treatment  Patient Details  Name: Megan Perry MRN: [id number] Date of Birth: 1956/04/04 Referring Provider (PT): Larry Sierras, MD   Encounter Date: 11/06/2020   PT End of Session - 11/06/20 0841    Visit Number 2    Number of Visits 6    Date for PT Re-Evaluation 12/04/20    Authorization Type [employer]; Progress note every 10th visit    PT Start Time 0830   late arrival   PT Stop Time 0917    PT Time Calculation (min) 47 min    Activity Tolerance Patient limited by pain    Behavior During Therapy [facility] for tasks assessed/performed           No past medical history on file.  No past surgical history on file.  There were no vitals filed for this visit.   Subjective Assessment - 11/06/20 0936    Subjective COVID-19 screening performed upon arrival. Patient arrives doing HEP but getting a leg cramps at night.    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 will stiffen up after sitting for long periods    How long can you stand comfortably? short periods    How long can you walk comfortably? short periods    Diagnostic tests x-ray: arthritis    Patient Stated Goals decrease pain, build strength    Currently in Pain? Yes   did not provide number on pain scale             [facility] PT Assessment - 11/06/20 0001      Assessment   Medical Diagnosis Bilateral chronic knee pain    Referring Provider (PT) Larry Sierras, MD    Next MD Visit to be made    Prior Therapy for hip                         [facility] Adult PT Treatment/Exercise - 11/06/20 0001      Exercises   Exercises Knee/Hip      Knee/Hip Exercises: Standing   Lateral Step Up Both;1 set;10 reps;Hand Hold: 2;Step Height: 4"    Rocker Board 3 minutes     Other Standing Knee Exercises lateral stepping with yellow theraband x3 minutes      Knee/Hip Exercises: Seated   Long Arc Quad AROM;Both;15 reps    Long Arc Quad Limitations with ball squeeze    Hamstring Curl Strengthening;Both;20 reps;10 reps   x20 left, x10 right     Modalities   Modalities Programmer, applications Location both knee joints    Electrical Stimulation Action pre-mod 2 channels    Electrical Stimulation Parameters 80-150 hz x10 mins    Electrical Stimulation Goals Pain      Vasopneumatic   Number Minutes Vasopneumatic  10 minutes    Vasopnuematic Location  Knee   bilateral   Vasopneumatic Pressure Low    Vasopneumatic Temperature  34 for edema and pain                       PT Long Term Goals - 11/05/20 1720      PT LONG TERM GOAL #1   Title Patient will be independent with HEP  Time 6    Period Weeks    Status New      PT LONG TERM GOAL #2   Title Patient will demonstrate 4+/5 or greater bilateral LE MMT to improve stability during functional tasks.    Time 6    Period Weeks    Status New      PT LONG TERM GOAL #3   Title Patient will demosntrate 120+ degrees of right knee flexion AROM to improve functional tasks.    Time 6    Period Weeks    Status New      PT LONG TERM GOAL #4   Title Patient will negotiate steps reciprocally with one railing to safely access basement for washer/dryer.    Time 6    Period Weeks    Status New      PT LONG TERM GOAL #5   Title Patient will report ability to perform ADLs, home activities, and pilates with bilateral knee pain less than or equal to 3/10.    Time 6    Period Weeks    Status New                 Plan - 11/06/20 0920    Clinical Impression Statement Patient was able to tolerate treatment fairly. Patient reported more discomfort and pain in R knee in comparison to left. Patient guided through TEs and provided  with verbal and tactile cuing with fair carryover with reps. E-stim and vaso appllied with no adverse affects.    Personal Factors and Comorbidities Age;Comorbidity 1    Comorbidities bilateral knee OA    Examination-Activity Limitations Locomotion Level;Transfers;Stairs;Squat;Stand    Examination-Participation Restrictions Yard Work    Stability/Clinical Decision Making Stable/Uncomplicated    Clinical Decision Making Low    Rehab Potential Fair    PT Frequency 2x / week    PT Duration 6 weeks    PT Treatment/Interventions ADLs/Self Care Home Management;Cryotherapy;Electrical Stimulation;Gait training;Stair training;Functional mobility training;Therapeutic activities;Therapeutic exercise;Neuromuscular re-education;Manual techniques;Passive range of motion;Patient/family education;Vasopneumatic Device;Taping    PT Next Visit Plan nustep, knee strengthening and stretching, hip strengthening, modalities PRN for pain relief NO MODALITIES AFTER 15TH VISIT ([employer])    PT Home Exercise Plan see patient education section    Consulted and Agree with Plan of Care Patient           Patient will benefit from skilled therapeutic intervention in order to improve the following deficits and impairments:  Decreased range of motion,Difficulty walking,Decreased activity tolerance,Decreased strength,Pain  Visit Diagnosis: Chronic pain of right knee  Chronic pain of left knee  Muscle weakness (generalized)  Localized edema     Problem List There are no problems to display for this patient.   Megan Perry 11/06/2020, 9:45 AM  [facility] [address] [place], [place], [address] Phone: [phone]   Fax:  [phone]  Name: Megan Perry MRN: [id number] Date of Birth: [birth year]

## 2020-11-16 ENCOUNTER — Encounter: Payer: Self-pay | Admitting: Physical Therapy

## 2020-11-16 ENCOUNTER — Ambulatory Visit: Admitting: Physical Therapy

## 2020-11-16 ENCOUNTER — Other Ambulatory Visit: Payer: Self-pay

## 2020-11-16 DIAGNOSIS — M25561 Pain in right knee: Secondary | ICD-10-CM | POA: Diagnosis not present

## 2020-11-16 DIAGNOSIS — G8929 Other chronic pain: Secondary | ICD-10-CM

## 2020-11-16 DIAGNOSIS — M25562 Pain in left knee: Secondary | ICD-10-CM

## 2020-11-16 DIAGNOSIS — M6281 Muscle weakness (generalized): Secondary | ICD-10-CM

## 2020-11-16 DIAGNOSIS — R600 Localized edema: Secondary | ICD-10-CM

## 2020-11-16 NOTE — Therapy (Signed)
[facility] [address] [place], [place], [address] Phone: [phone]   Fax:  [phone]  Physical Therapy Treatment  Patient Details  Name: Megan Perry MRN: [id number] Date of Birth: Jan 27, 1956 Referring Provider (PT): Larry Sierras, MD   Encounter Date: 11/16/2020   PT End of Session - 11/16/20 0955    Visit Number 3    Number of Visits 6    Date for PT Re-Evaluation 12/04/20    Authorization Type [employer]; Progress note every 10th visit    PT Start Time 0947    PT Stop Time 1034    PT Time Calculation (min) 47 min    Activity Tolerance Patient limited by pain;Patient tolerated treatment well    Behavior During Therapy [facility] for tasks assessed/performed           History reviewed. No pertinent past medical history.  History reviewed. No pertinent surgical history.  There were no vitals filed for this visit.   Subjective Assessment - 11/16/20 0953    Subjective COVID-19 screening performed upon arrival. Patient reports walking in the yard on Saturday and her pain levels increased. Patient states she used her TENs unit throughout her trip which helped reduce pain.    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 Megan stiffen up after sitting for long periods    How long can you stand comfortably? short periods    How long can you walk comfortably? short periods    Diagnostic tests x-ray: arthritis    Patient Stated Goals decrease pain, build strength    Currently in Pain? Yes    Pain Score 3     Pain Location Knee    Pain Orientation Right;Left    Pain Descriptors / Indicators Sore;Aching    Pain Type Chronic pain    Pain Onset More than a month ago    Pain Frequency Constant              [facility] PT Assessment - 11/16/20 0001      Assessment   Medical Diagnosis Bilateral chronic knee pain    Referring Provider (PT)  Larry Sierras, MD    Next MD Visit to be made    Prior Therapy for hip                         [facility] Adult PT Treatment/Exercise - 11/16/20 0001      Exercises   Exercises Knee/Hip      Knee/Hip Exercises: Aerobic   Nustep level 3 x10 mins      Knee/Hip Exercises: Standing   Terminal Knee Extension Strengthening;2 sets;10 reps;Both    Theraband Level (Terminal Knee Extension) Level 2 (Red)    Lateral Step Up Both;10 reps;2 sets;Hand Hold: 2;Step Height: 4"    Rocker Board 3 minutes      Knee/Hip Exercises: Seated   Long Arc Quad AROM;Both;20 reps    Long Arc Quad Limitations with ball squeeze    Hamstring Curl Strengthening;Both;20 reps   x20 red theraband     Modalities   Modalities Programmer, applications Location both knee joints    Electrical Stimulation Action pre-mod 2 channels    Electrical Stimulation Parameters 80-150 hz x10 mins    Electrical Stimulation Goals Pain      Vasopneumatic   Number Minutes Vasopneumatic  10 minutes    Vasopnuematic Location  Knee   bilateral   Vasopneumatic Pressure Low    Vasopneumatic Temperature  34 for edema and pain                       PT Long Term Goals - 11/05/20 1720      PT LONG TERM GOAL #1   Title Patient Megan be independent with HEP    Time 6    Period Weeks    Status New      PT LONG TERM GOAL #2   Title Patient Megan demonstrate 4+/5 or greater bilateral LE MMT to improve stability during functional tasks.    Time 6    Period Weeks    Status New      PT LONG TERM GOAL #3   Title Patient Megan demosntrate 120+ degrees of right knee flexion AROM to improve functional tasks.    Time 6    Period Weeks    Status New      PT LONG TERM GOAL #4   Title Patient Megan negotiate steps reciprocally with one railing to safely access basement for washer/dryer.    Time 6    Period Weeks    Status New      PT LONG TERM GOAL  #5   Title Patient Megan report ability to perform ADLs, home activities, and pilates with bilateral knee pain less than or equal to 3/10.    Time 6    Period Weeks    Status New                 Plan - 11/16/20 1006    Clinical Impression Statement Patient responded fairly well but with ongoing pain R>L. Patient educated on stair negotiation and importance of working on building strength in both LEs. Patient demonstrated good form with all after explanation of slow controlled movements. E-stim and vaso applied for pain relief and edema control with no adverse affects upon removal.    Personal Factors and Comorbidities Age;Comorbidity 1    Comorbidities bilateral knee OA    Examination-Activity Limitations Locomotion Level;Transfers;Stairs;Squat;Stand    Examination-Participation Restrictions Yard Work    Stability/Clinical Decision Making Stable/Uncomplicated    Clinical Decision Making Low    Rehab Potential Fair    PT Frequency 2x / week    PT Duration 6 weeks    PT Treatment/Interventions ADLs/Self Care Home Management;Cryotherapy;Electrical Stimulation;Gait training;Stair training;Functional mobility training;Therapeutic activities;Therapeutic exercise;Neuromuscular re-education;Manual techniques;Passive range of motion;Patient/family education;Vasopneumatic Device;Taping    PT Next Visit Plan nustep, knee strengthening and stretching, hip strengthening, modalities PRN for pain relief NO MODALITIES AFTER 15TH VISIT (TRICARE)    PT Home Exercise Plan see patient education section    Consulted and Agree with Plan of Care Patient           Patient Megan benefit from skilled therapeutic intervention in order to improve the following deficits and impairments:  Decreased range of motion,Difficulty walking,Decreased activity tolerance,Decreased strength,Pain  Visit Diagnosis: Chronic pain of right knee  Chronic pain of left knee  Muscle weakness (generalized)  Localized  edema     Problem List There are no problems to display for this patient.   Guss Bunde, PT, DPT 11/16/2020, 10:38 AM  [facility] [address] [place], [place], [address] Phone: [phone]   Fax:  [phone]  Name: Megan Perry MRN: [id number] Date of Birth: Apr 11, 1956

## 2020-11-20 ENCOUNTER — Other Ambulatory Visit: Payer: Self-pay

## 2020-11-20 ENCOUNTER — Encounter: Payer: Self-pay | Admitting: Physical Therapy

## 2020-11-20 ENCOUNTER — Ambulatory Visit: Admitting: Physical Therapy

## 2020-11-20 DIAGNOSIS — M25561 Pain in right knee: Secondary | ICD-10-CM | POA: Diagnosis not present

## 2020-11-20 DIAGNOSIS — M6281 Muscle weakness (generalized): Secondary | ICD-10-CM

## 2020-11-20 DIAGNOSIS — G8929 Other chronic pain: Secondary | ICD-10-CM

## 2020-11-20 DIAGNOSIS — R600 Localized edema: Secondary | ICD-10-CM

## 2020-11-20 NOTE — Therapy (Signed)
[facility] [address] [place], [place], [address] Phone: [phone]   Fax:  [phone]  Physical Therapy Treatment  Patient Details  Name: Megan Perry MRN: [id number] Date of Birth: 1956-06-07 Referring Provider (PT): Larry Sierras, MD   Encounter Date: 11/20/2020   PT End of Session - 11/20/20 1217    Visit Number 4    Number of Visits 6    Date for PT Re-Evaluation 12/04/20    Authorization Type [employer]; Progress note every 10th visit    PT Start Time 0945    PT Stop Time 1033    PT Time Calculation (min) 48 min    Activity Tolerance Patient limited by pain;Patient tolerated treatment well    Behavior During Therapy [facility] for tasks assessed/performed           History reviewed. No pertinent past medical history.  History reviewed. No pertinent surgical history.  There were no vitals filed for this visit.   Subjective Assessment - 11/20/20 0951    Subjective COVID-19 screening performed upon arrival. 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 will stiffen up after sitting for long periods    How long can you stand comfortably? short periods    How long can you walk comfortably? short periods    Patient Stated Goals decrease pain, build strength    Currently in Pain? Yes   did not provide number on pain scale             [facility] PT Assessment - 11/20/20 0001      Assessment   Medical Diagnosis Bilateral chronic knee pain    Referring Provider (PT) Larry Sierras, MD    Next MD Visit to be made    Prior Therapy for hip                         [facility] Adult PT Treatment/Exercise - 11/20/20 0001      Exercises   Exercises Knee/Hip      Knee/Hip Exercises: Aerobic   Nustep level 3 x15 mins      Knee/Hip Exercises: Standing   Forward Step Up Both;2 sets;10 reps;Hand  Hold: 2;Step Height: 6"    Step Down Both;2 sets;10 reps;Hand Hold: 2;Step Height: 6"    Rocker Board 3 minutes    Other Standing Knee Exercises lateral stepping on balance beam x3 minutes      Modalities   Modalities Electrical Stimulation;Vasopneumatic      Electrical Stimulation   Electrical Stimulation Location both knee joints    Electrical Stimulation Action pre-mod 2 channels    Electrical Stimulation Parameters 80-150 hz x10 mins    Electrical Stimulation Goals Pain      Vasopneumatic   Number Minutes Vasopneumatic  10 minutes    Vasopnuematic Location  Knee   bilateral   Vasopneumatic Pressure Low    Vasopneumatic Temperature  34 for edema and pain                       PT Long Term Goals - 11/05/20 1720      PT LONG TERM GOAL #1   Title Patient will be independent with HEP    Time 6    Period Weeks    Status New      PT LONG TERM GOAL #2  Title Patient will demonstrate 4+/5 or greater bilateral LE MMT to improve stability during functional tasks.    Time 6    Period Weeks    Status New      PT LONG TERM GOAL #3   Title Patient will demosntrate 120+ degrees of right knee flexion AROM to improve functional tasks.    Time 6    Period Weeks    Status New      PT LONG TERM GOAL #4   Title Patient will negotiate steps reciprocally with one railing to safely access basement for washer/dryer.    Time 6    Period Weeks    Status New      PT LONG TERM GOAL #5   Title Patient will report ability to perform ADLs, home activities, and pilates with bilateral knee pain less than or equal to 3/10.    Time 6    Period Weeks    Status New                 Plan - 11/20/20 1220    Clinical Impression Statement Patient arrives doing fairly well but with ongoing pain. Patient was able to tolerate treatment with verbal and tactile cuing for form and technique. Patient with more R dynamic valgus during ambulation. No adverse affects upon removal of  modalities.    Personal Factors and Comorbidities Age;Comorbidity 1    Comorbidities bilateral knee OA    Examination-Activity Limitations Locomotion Level;Transfers;Stairs;Squat;Stand    Examination-Participation Restrictions Yard Work    Stability/Clinical Decision Making Stable/Uncomplicated    Clinical Decision Making Low    Rehab Potential Fair    PT Frequency 2x / week    PT Duration 6 weeks    PT Treatment/Interventions ADLs/Self Care Home Management;Cryotherapy;Electrical Stimulation;Gait training;Stair training;Functional mobility training;Therapeutic activities;Therapeutic exercise;Neuromuscular re-education;Manual techniques;Passive range of motion;Patient/family education;Vasopneumatic Device;Taping    PT Next Visit Plan nustep, knee strengthening and stretching, hip strengthening, modalities PRN for pain relief NO MODALITIES AFTER 15TH VISIT ([employer]ICARE)    PT Home Exercise Plan see patient education section    Consulted and Agree with Plan of Care Patient           Patient will benefit from skilled therapeutic intervention in order to improve the following deficits and impairments:  Decreased range of motion,Difficulty walking,Decreased activity tolerance,Decreased strength,Pain  Visit Diagnosis: Chronic pain of right knee  Chronic pain of left knee  Muscle weakness (generalized)  Localized edema     Problem List There are no problems to display for this patient.   Guss Bunde, PT, DPT 11/20/2020, 12:23 PM  [facility] [address] [place], [place], [address] Phone: [phone]   Fax:  [phone]  Name: Akiera Allbaugh MRN: [id number] Date of Birth: 02/20/1956

## 2020-11-23 ENCOUNTER — Other Ambulatory Visit: Payer: Self-pay

## 2020-11-23 ENCOUNTER — Encounter: Payer: Self-pay | Admitting: Physical Therapy

## 2020-11-23 ENCOUNTER — Ambulatory Visit: Admitting: Physical Therapy

## 2020-11-23 DIAGNOSIS — G8929 Other chronic pain: Secondary | ICD-10-CM

## 2020-11-23 DIAGNOSIS — M6281 Muscle weakness (generalized): Secondary | ICD-10-CM

## 2020-11-23 DIAGNOSIS — R600 Localized edema: Secondary | ICD-10-CM

## 2020-11-23 DIAGNOSIS — M25561 Pain in right knee: Secondary | ICD-10-CM | POA: Diagnosis not present

## 2020-11-23 NOTE — Therapy (Signed)
[facility] [address] [place], [place], [address] Phone: [phone]   Fax:  [phone]  Physical Therapy Treatment  Patient Details  Name: Megan Perry MRN: [id number] Date of Birth: 11-Sep-1955 Referring Provider (PT): Larry Sierras, MD   Encounter Date: 11/23/2020   PT End of Session - 11/23/20 1445    Visit Number 5    Number of Visits 6    Date for PT Re-Evaluation 12/04/20    Authorization Type [employer]; Progress note every 10th visit    PT Start Time 1433    PT Stop Time 1517    PT Time Calculation (min) 44 min    Activity Tolerance Patient limited by pain;Patient tolerated treatment well    Behavior During Therapy [facility] for tasks assessed/performed           History reviewed. No pertinent past medical history.  History reviewed. No pertinent surgical history.  There were no vitals filed for this visit.   Subjective Assessment - 11/23/20 1443    Subjective COVID-19 screening performed upon arrival. Patient arrives with ongoing bilateral knee pain.    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 will stiffen up after sitting for long periods    How long can you stand comfortably? short periods    How long can you walk comfortably? short periods    Diagnostic tests x-ray: arthritis    Patient Stated Goals decrease pain, build strength    Currently in Pain? Yes   did not provide number on pain scale             [facility] PT Assessment - 11/23/20 0001      Assessment   Medical Diagnosis Bilateral chronic knee pain    Referring Provider (PT) Larry Sierras, MD    Next MD Visit to be made    Prior Therapy for hip                         [facility] Adult PT Treatment/Exercise - 11/23/20 0001      Exercises   Exercises Knee/Hip      Knee/Hip Exercises: Aerobic   Nustep level 3 x15 mins      Knee/Hip  Exercises: Standing   Forward Lunges Both;2 sets;10 reps;2 seconds    Forward Lunges Limitations 8" step    Terminal Knee Extension --    Theraband Level (Terminal Knee Extension) --    Hip Abduction AROM;Both;2 sets;10 reps;Knee straight    Rocker Board 3 minutes    Other Standing Knee Exercises lateral stepping on balance beam x3 minutes      Knee/Hip Exercises: Seated   Long Arc Quad AROM;Both;20 reps    Long Arc Quad Weight 2 lbs.    Long [employer] Limitations --      Database administrator Action pre-mod 2 channels    Electrical Stimulation Parameters 80-150 hz x10 mins    Electrical Stimulation Goals Pain      Vasopneumatic   Number Minutes Vasopneumatic  10 minutes    Vasopnuematic Location  Knee    Vasopneumatic Pressure Low    Vasopneumatic Temperature  34 for edema and pain  PT Long Term Goals - 11/05/20 1720      PT LONG TERM GOAL #1   Title Patient will be independent with HEP    Time 6    Period Weeks    Status New      PT LONG TERM GOAL #2   Title Patient will demonstrate 4+/5 or greater bilateral LE MMT to improve stability during functional tasks.    Time 6    Period Weeks    Status New      PT LONG TERM GOAL #3   Title Patient will demosntrate 120+ degrees of right knee flexion AROM to improve functional tasks.    Time 6    Period Weeks    Status New      PT LONG TERM GOAL #4   Title Patient will negotiate steps reciprocally with one railing to safely access basement for washer/dryer.    Time 6    Period Weeks    Status New      PT LONG TERM GOAL #5   Title Patient will report ability to perform ADLs, home activities, and pilates with bilateral knee pain less than or equal to 3/10.    Time 6    Period Weeks    Status New                 Plan - 11/23/20  2016    Clinical Impression Statement Patient arrives to physical therapy doing fair. Patient reported some more discomfort with box lunge therefore exercise terminated after 2x10. Patient was able to tolerate additional weight with LAQ. Patient inquired about doing tibiofemoral joint distraction with ankle weights at home. Patient instructed it was okay to perform. No adverse affects upon removal of modalities. Assess goals next visit.    Personal Factors and Comorbidities Age;Comorbidity 1    Comorbidities bilateral knee OA    Examination-Activity Limitations Locomotion Level;Transfers;Stairs;Squat;Stand    Examination-Participation Restrictions Yard Work    Stability/Clinical Decision Making Stable/Uncomplicated    Clinical Decision Making Low    Rehab Potential Fair    PT Frequency 2x / week    PT Duration 6 weeks    PT Treatment/Interventions ADLs/Self Care Home Management;Cryotherapy;Electrical Stimulation;Gait training;Stair training;Functional mobility training;Therapeutic activities;Therapeutic exercise;Neuromuscular re-education;Manual techniques;Passive range of motion;Patient/family education;Vasopneumatic Device;Taping    PT Next Visit Plan nustep, knee strengthening and stretching, hip strengthening, modalities PRN for pain relief NO MODALITIES AFTER 15TH VISIT ([employer]ICARE)    PT Home Exercise Plan see patient education section    Consulted and Agree with Plan of Care Patient           Patient will benefit from skilled therapeutic intervention in order to improve the following deficits and impairments:  Decreased range of motion,Difficulty walking,Decreased activity tolerance,Decreased strength,Pain  Visit Diagnosis: Chronic pain of right knee  Chronic pain of left knee  Muscle weakness (generalized)  Localized edema     Problem List There are no problems to display for this patient.   Guss Bunde, PT, DPT 11/23/2020, 8:24 PM  [facility] [address] [place], [place], [address] Phone: [phone]   Fax:  [phone]  Name: Megan Perry MRN: [id number] Date of Birth: 10/03/1955

## 2020-11-25 ENCOUNTER — Encounter: Payer: Self-pay | Admitting: Physical Therapy

## 2020-11-25 ENCOUNTER — Other Ambulatory Visit: Payer: Self-pay

## 2020-11-25 ENCOUNTER — Ambulatory Visit: Admitting: Physical Therapy

## 2020-11-25 DIAGNOSIS — R600 Localized edema: Secondary | ICD-10-CM

## 2020-11-25 DIAGNOSIS — M25562 Pain in left knee: Secondary | ICD-10-CM

## 2020-11-25 DIAGNOSIS — M6281 Muscle weakness (generalized): Secondary | ICD-10-CM

## 2020-11-25 DIAGNOSIS — G8929 Other chronic pain: Secondary | ICD-10-CM

## 2020-11-25 DIAGNOSIS — M25561 Pain in right knee: Secondary | ICD-10-CM | POA: Diagnosis not present

## 2020-11-25 NOTE — Addendum Note (Signed)
Addended by: Guss Bunde on: 11/25/2020 08:06 PM   Modules accepted: Orders

## 2020-11-25 NOTE — Therapy (Addendum)
[facility] [address] [place], [place], [address] Phone: [phone]   Fax:  [phone]  Physical Therapy Treatment  Patient Details  Name: Megan Perry MRN: [id number] Date of Birth: 10-09-1955 Referring Provider (PT): Larry Sierras, MD   Encounter Date: 11/25/2020   PT End of Session - 11/25/20 1304    Visit Number 6    Number of Visits 12    Date for PT Re-Evaluation 12/25/20    Authorization Type [employer]; Progress note every 10th visit    PT Start Time 1302    PT Stop Time 1352    PT Time Calculation (min) 50 min    Activity Tolerance Patient tolerated treatment well    Behavior During Therapy [facility] for tasks assessed/performed           History reviewed. No pertinent past medical history.  History reviewed. No pertinent surgical history.  There were no vitals filed for this visit.   Subjective Assessment - 11/25/20 1304    Subjective COVID-19 screening performed upon arrival. Patient arrives with ongoing bilateral knee pain.    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 will stiffen up after sitting for long periods    How long can you stand comfortably? short periods    How long can you walk comfortably? short periods    Diagnostic tests x-ray: arthritis    Patient Stated Goals decrease pain, build strength    Currently in Pain? Yes    Pain Score 2     Pain Location Knee    Pain Orientation Left;Right    Pain Descriptors / Indicators Discomfort    Pain Type Chronic pain    Pain Onset More than a month ago    Pain Frequency Constant              [facility] PT Assessment - 11/25/20 0001      Assessment   Medical Diagnosis Bilateral chronic knee pain    Referring Provider (PT) Larry Sierras, MD    Next MD Visit to be made    Prior Therapy for hip      Precautions   Precautions None      Restrictions   Weight  Bearing Restrictions No                         [facility] Adult PT Treatment/Exercise - 11/25/20 0001      Knee/Hip Exercises: Aerobic   Nustep L4, seat 11 x15 min      Knee/Hip Exercises: Standing   Heel Raises Both;20 reps    Heel Raises Limitations B toe raise x20 reps    Hip Abduction AROM;Both;2 sets;10 reps;Knee straight      Knee/Hip Exercises: Seated   Long Arc Quad Strengthening;Both;3 sets;10 reps;Weights    Long Arc Quad Weight 4 lbs.      Knee/Hip Exercises: Supine   Straight Leg Raises AROM;Both;2 sets;10 reps      Modalities   Modalities Geologist, engineering Location b knee    Electrical Stimulation Action Pre-Mod    Electrical Stimulation Parameters 80-150 hz x10 min    Electrical Stimulation Goals Pain                       PT Long Term Goals - 11/05/20 1720  PT LONG TERM GOAL #1   Title Patient will be independent with HEP    Time 6    Period Weeks    Status New      PT LONG TERM GOAL #2   Title Patient will demonstrate 4+/5 or greater bilateral LE MMT to improve stability during functional tasks.    Time 6    Period Weeks    Status New      PT LONG TERM GOAL #3   Title Patient will demosntrate 120+ degrees of right knee flexion AROM to improve functional tasks.    Time 6    Period Weeks    Status New      PT LONG TERM GOAL #4   Title Patient will negotiate steps reciprocally with one railing to safely access basement for washer/dryer.    Time 6    Period Weeks    Status New      PT LONG TERM GOAL #5   Title Patient will report ability to perform ADLs, home activities, and pilates with bilateral knee pain less than or equal to 3/10.    Time 6    Period Weeks    Status New                 Plan - 11/25/20 1543    Clinical Impression Statement Patient reported less B knee pain. Patient did not report any increased pain but was guided through  strengtheing of BLEs. Minimal extensor lag noted during SLR. Normal stimulation response noted following removal of the modality.    Personal Factors and Comorbidities Age;Comorbidity 1    Comorbidities bilateral knee OA    Examination-Activity Limitations Locomotion Level;Transfers;Stairs;Squat;Stand    Examination-Participation Restrictions Yard Work    Stability/Clinical Decision Making Stable/Uncomplicated    Rehab Potential Fair    PT Frequency 2x / week    PT Duration 6 weeks    PT Treatment/Interventions ADLs/Self Care Home Management;Cryotherapy;Electrical Stimulation;Gait training;Stair training;Functional mobility training;Therapeutic activities;Therapeutic exercise;Neuromuscular re-education;Manual techniques;Passive range of motion;Patient/family education;Vasopneumatic Device;Taping    PT Next Visit Plan nustep, knee strengthening and stretching, hip strengthening, modalities PRN for pain relief NO MODALITIES AFTER 15TH VISIT (TRICARE)    PT Home Exercise Plan see patient education section    Consulted and Agree with Plan of Care Patient           Patient will benefit from skilled therapeutic intervention in order to improve the following deficits and impairments:  Decreased range of motion,Difficulty walking,Decreased activity tolerance,Decreased strength,Pain  Visit Diagnosis: Chronic pain of right knee  Chronic pain of left knee  Muscle weakness (generalized)  Localized edema     Problem List There are no problems to display for this patient.   [name], PTA 11/25/2020, 8:05 PM  [facility] [address] [place], [place], [address] Phone: [phone]   Fax:  [phone]  Name: Megan Perry MRN: [id number] Date of Birth: 1955/12/23

## 2020-12-01 ENCOUNTER — Ambulatory Visit: Payer: Medicare Other | Attending: Adult Reconstructive Orthopaedic Surgery | Admitting: Physical Therapy

## 2020-12-01 ENCOUNTER — Other Ambulatory Visit: Payer: Self-pay

## 2020-12-01 DIAGNOSIS — G8929 Other chronic pain: Secondary | ICD-10-CM | POA: Diagnosis present

## 2020-12-01 DIAGNOSIS — R600 Localized edema: Secondary | ICD-10-CM | POA: Diagnosis present

## 2020-12-01 DIAGNOSIS — M25561 Pain in right knee: Secondary | ICD-10-CM | POA: Diagnosis present

## 2020-12-01 DIAGNOSIS — M6281 Muscle weakness (generalized): Secondary | ICD-10-CM | POA: Diagnosis present

## 2020-12-01 DIAGNOSIS — M25562 Pain in left knee: Secondary | ICD-10-CM | POA: Diagnosis present

## 2020-12-01 NOTE — Therapy (Signed)
[facility] [address] [place], [place], [address] Phone: [phone]   Fax:  [phone]  Physical Therapy Treatment  Patient Details  Name: Megan Perry MRN: [id number] Date of Birth: 1955/11/28 Referring Provider (PT): Larry Sierras, MD   Encounter Date: 12/01/2020   PT End of Session - 12/01/20 1744    Visit Number 7    Number of Visits 12    Date for PT Re-Evaluation 12/25/20    Authorization Type [employer]; Progress note every 10th visit    PT Start Time 0445    PT Stop Time 0534    PT Time Calculation (min) 49 min    Activity Tolerance Patient tolerated treatment well    Behavior During Therapy [facility] for tasks assessed/performed           No past medical history on file.  No past surgical history on file.  There were no vitals filed for this visit.   Subjective Assessment - 12/01/20 1652    Subjective COVID-19 screen performed prior to patient entering clinic.  No new complaints.    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 will stiffen up after sitting for long periods    How long can you stand comfortably? short periods    How long can you walk comfortably? short periods    Diagnostic tests x-ray: arthritis    Patient Stated Goals decrease pain, build strength    Currently in Pain? Yes    Pain Score 5     Pain Location Knee    Pain Orientation Right;Left    Pain Onset More than a month ago                             [facility] Adult PT Treatment/Exercise - 12/01/20 0001      Exercises   Exercises Knee/Hip      Knee/Hip Exercises: Aerobic   Recumbent Bike Level  2 x 5 minutes.    Nustep Level 5 x 10 minutes.      Knee/Hip Exercises: Machines for Strengthening   Cybex Knee Extension 10# x 2 minutes pain-free range of motion (~90 to 45 degrees).    Cybex Knee Flexion 30# x 2 minutes.       Modalities   Modalities Electrical Stimulation;Moist Heat      Moist Heat Therapy   Number Minutes Moist Heat 15 Minutes    Moist Heat Location --   Bilateral knees.     Programme researcher, broadcasting/film/video Location Bil knees.    Electrical Stimulation Action Pre-mod.    Electrical Stimulation Parameters 80-150 Hz x 15 minutes.    Electrical Stimulation Goals Pain      Manual Therapy   Manual Therapy Joint mobilization    Joint Mobilization In supine:  Right patellar mobs x 5 minutes.                       PT Long Term Goals - 11/05/20 1720      PT LONG TERM GOAL #1   Title Patient will be independent with HEP    Time 6    Period Weeks    Status New      PT LONG TERM GOAL #2   Title Patient will demonstrate 4+/5 or greater bilateral LE MMT to improve stability during functional tasks.  Time 6    Period Weeks    Status New      PT LONG TERM GOAL #3   Title Patient will demosntrate 120+ degrees of right knee flexion AROM to improve functional tasks.    Time 6    Period Weeks    Status New      PT LONG TERM GOAL #4   Title Patient will negotiate steps reciprocally with one railing to safely access basement for washer/dryer.    Time 6    Period Weeks    Status New      PT LONG TERM GOAL #5   Title Patient will report ability to perform ADLs, home activities, and pilates with bilateral knee pain less than or equal to 3/10.    Time 6    Period Weeks    Status New                 Plan - 12/01/20 1741    Clinical Impression Statement The patient did well today with the addition of the recumbant bike on level 2 and knee extension and ham curl machines performed through a pain-free range of motion.    Personal Factors and Comorbidities Age;Comorbidity 1    Comorbidities bilateral knee OA    Examination-Activity Limitations Locomotion Level;Transfers;Stairs;Squat;Stand    Examination-Participation Restrictions Yard Work     Stability/Clinical Decision Making Stable/Uncomplicated    Rehab Potential Fair    PT Frequency 2x / week    PT Duration 6 weeks    PT Treatment/Interventions ADLs/Self Care Home Management;Cryotherapy;Electrical Stimulation;Gait training;Stair training;Functional mobility training;Therapeutic activities;Therapeutic exercise;Neuromuscular re-education;Manual techniques;Passive range of motion;Patient/family education;Vasopneumatic Device;Taping    PT Next Visit Plan nustep, knee strengthening and stretching, hip strengthening, modalities PRN for pain relief NO MODALITIES AFTER 15TH VISIT ([employer])    PT Home Exercise Plan see patient education section    Consulted and Agree with Plan of Care Patient           Patient will benefit from skilled therapeutic intervention in order to improve the following deficits and impairments:  Decreased range of motion,Difficulty walking,Decreased activity tolerance,Decreased strength,Pain  Visit Diagnosis: Chronic pain of right knee  Chronic pain of left knee     Problem List There are no problems to display for this patient.   Meryl Ponder, [place] MPT 12/01/2020, 5:46 PM  [facility] [address] [place], [place], [address] Phone: [phone]   Fax:  [phone]  Name: Megan Perry MRN: [id number] Date of Birth: 1956-08-23

## 2020-12-04 ENCOUNTER — Other Ambulatory Visit: Payer: Self-pay

## 2020-12-04 ENCOUNTER — Ambulatory Visit: Payer: Medicare Other | Admitting: *Deleted

## 2020-12-04 DIAGNOSIS — M25561 Pain in right knee: Secondary | ICD-10-CM | POA: Diagnosis not present

## 2020-12-04 DIAGNOSIS — G8929 Other chronic pain: Secondary | ICD-10-CM

## 2020-12-04 DIAGNOSIS — M6281 Muscle weakness (generalized): Secondary | ICD-10-CM

## 2020-12-04 DIAGNOSIS — R600 Localized edema: Secondary | ICD-10-CM

## 2020-12-04 DIAGNOSIS — M25562 Pain in left knee: Secondary | ICD-10-CM

## 2020-12-04 NOTE — Therapy (Signed)
[facility] [address] [place], [place], [address] Phone: [phone]   Fax:  [phone]  Physical Therapy Treatment  Patient Details  Name: Kateryn Marasigan MRN: [id number] Date of Birth: 20-Nov-1955 Referring Provider (PT): Larry Sierras, MD   Encounter Date: 12/04/2020   PT End of Session - 12/04/20 1109    Visit Number 8    Number of Visits 12    Date for PT Re-Evaluation 12/25/20    Authorization Type [employer]; Progress note every 10th visit    PT Start Time 1030    PT Stop Time 1120    PT Time Calculation (min) 50 min    Activity Tolerance Patient tolerated treatment well           No past medical history on file.  No past surgical history on file.  There were no vitals filed for this visit.   Subjective Assessment - 12/04/20 1044    Subjective COVID-19 screen performed prior to patient entering clinic. 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 short periods    How long can you walk comfortably? short periods    Diagnostic tests x-ray: arthritis    Patient Stated Goals decrease pain, build strength    Currently in Pain? Yes    Pain Score 4     Pain Location Knee    Pain Orientation Right;Left    Pain Type Chronic pain                             [facility] Adult PT Treatment/Exercise - 12/04/20 0001      Exercises   Exercises Knee/Hip      Knee/Hip Exercises: Aerobic   Recumbent Bike Level  2 x 5 minutes.    Nustep Level 5 x 10 minutes.      Knee/Hip Exercises: Machines for Strengthening   Cybex Knee Extension 10# x 2 minutes pain-free range of motion (~90 to 45 degrees).    Cybex Knee Flexion 30# x 2 minutes.      Knee/Hip Exercises: Standing   Heel Raises Both;20 reps    Heel Raises Limitations B toe raise x20 reps    Hip Abduction AROM;Both;2 sets;10 reps;Knee straight       Modalities   Modalities Electrical Stimulation;Moist Heat      Moist Heat Therapy   Number Minutes Moist Heat 15 Minutes    Moist Heat Location --   Bilateral knees.     Programme researcher, broadcasting/film/video Location Bil knees.    Designer, industrial/product Parameters 80-150 hz x 15 mins    Electrical Stimulation Goals Pain                       PT Long Term Goals - 11/05/20 1720      PT LONG TERM GOAL #1   Title Patient will be independent with HEP    Time 6    Period Weeks    Status New      PT LONG TERM GOAL #2   Title Patient will demonstrate 4+/5 or greater bilateral LE MMT to improve stability during functional tasks.    Time 6    Period Weeks    Status New      PT LONG TERM GOAL #3  Title Patient will demosntrate 120+ degrees of right knee flexion AROM to improve functional tasks.    Time 6    Period Weeks    Status New      PT LONG TERM GOAL #4   Title Patient will negotiate steps reciprocally with one railing to safely access basement for washer/dryer.    Time 6    Period Weeks    Status New      PT LONG TERM GOAL #5   Title Patient will report ability to perform ADLs, home activities, and pilates with bilateral knee pain less than or equal to 3/10.    Time 6    Period Weeks    Status New                 Plan - 12/04/20 1047    Clinical Impression Statement Pt arrived today doing some better with pain levels today. She was able to perform all strengthening exs for LT and RT LE with mainly mm fatigue end of Rx. Pt OOT next week.    Personal Factors and Comorbidities Age;Comorbidity 1    Comorbidities bilateral knee OA    Examination-Activity Limitations Locomotion Level;Transfers;Stairs;Squat;Stand    PT Treatment/Interventions ADLs/Self Care Home Management;Cryotherapy;Electrical Stimulation;Gait training;Stair training;Functional mobility training;Therapeutic activities;Therapeutic  exercise;Neuromuscular re-education;Manual techniques;Passive range of motion;Patient/family education;Vasopneumatic Device;Taping    PT Next Visit Plan nustep, knee strengthening and stretching, hip strengthening, modalities PRN for pain relief NO MODALITIES AFTER 15TH VISIT ([employer])    PT Home Exercise Plan see patient education section           Patient will benefit from skilled therapeutic intervention in order to improve the following deficits and impairments:  Decreased range of motion,Difficulty walking,Decreased activity tolerance,Decreased strength,Pain  Visit Diagnosis: Chronic pain of right knee  Chronic pain of left knee  Muscle weakness (generalized)  Localized edema     Problem List There are no problems to display for this patient.   Elysa Womac,CHRIS , PTA 12/04/2020, 12:28 PM  [facility] [address] [place], [place], [address] Phone: [phone]   Fax:  [phone]  Name: Airabella Barley MRN: [id number] Date of Birth: 1956-08-20

## 2020-12-15 ENCOUNTER — Ambulatory Visit: Payer: Medicare Other | Admitting: Physical Therapy

## 2020-12-18 ENCOUNTER — Ambulatory Visit: Payer: Medicare Other | Admitting: *Deleted

## 2021-01-26 ENCOUNTER — Other Ambulatory Visit: Payer: Self-pay

## 2021-01-26 ENCOUNTER — Ambulatory Visit: Payer: Medicare Other | Attending: Adult Reconstructive Orthopaedic Surgery | Admitting: Physical Therapy

## 2021-01-26 DIAGNOSIS — M25561 Pain in right knee: Secondary | ICD-10-CM | POA: Diagnosis not present

## 2021-01-26 DIAGNOSIS — G8929 Other chronic pain: Secondary | ICD-10-CM | POA: Insufficient documentation

## 2021-01-26 NOTE — Therapy (Signed)
[facility] [address] [place], [place], [address] Phone: [phone]   Fax:  [phone]  Physical Therapy Evaluation  Patient Details  Name: Megan Perry MRN: [id number] Date of Birth: 1955/12/31 Referring Provider (PT): Larry Sierras MD   Encounter Date: 01/26/2021   PT End of Session - 01/26/21 1430    Visit Number 1    Number of Visits 12    Date for PT Re-Evaluation 03/09/21    PT Start Time 0152    PT Stop Time 0229    PT Time Calculation (min) 37 min    Activity Tolerance Patient tolerated treatment well    Behavior During Therapy [facility] for tasks assessed/performed           No past medical history on file.  No past surgical history on file.  There were no vitals filed for this visit.    Subjective Assessment - 01/26/21 1440    Subjective COVID-19 screen performed prior to patient entering clinic.  The patient presents to the clinic today s/p right total knee replacement performed on 12/28/20.  She had some DH PT and is complinat to her HEP.  She presented to the clinic today walking safely with a straight cane.  Her pain is low today but was higher over the weekend as she put flags on graves for [facility] day.    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             [facility] PT Assessment - 01/26/21 0001      Assessment   Medical Diagnosis S/p rigt total knee arthroplasty.    Referring Provider (PT) Larry Sierras MD    Onset Date/Surgical Date --   12/28/20 (surgery date).     Precautions   Precaution Comments No ultrasound.      Restrictions   Weight Bearing Restrictions No      Balance Screen   Has the patient fallen in the past 6 months No    Has the patient had a decrease in activity level because of a fear of falling?  No    Is the patient reluctant to leave their home because of a fear of falling?  No      Prior Function   Level of Independence Independent       Observation/Other Assessments   Observations Right knee incision appears to be healing well.      Observation/Other Assessments-Edema    Edema Circumferential      Circumferential Edema   Circumferential - Right RT 4 cms > LT.      ROM / Strength   AROM / PROM / Strength AROM;Strength      AROM   Overall AROM Comments RT knee AROM is -5 to 103 degrees.      Strength   Overall Strength Comments Right hip and knee is 4+/5.      Palpation   Palpation comment Mild palpable pain complaints around right anterior knee.      Ambulation/Gait   Gait Comments Safe gait pattern with straight cane.                      Objective measurements completed on examination: See above findings.       [facility] Adult PT Treatment/Exercise - 01/26/21 0001      Vasopneumatic   Number Minutes Vasopneumatic  10 minutes    Vasopnuematic Location  --  Right knee.   Vasopneumatic Pressure Low                       PT Long Term Goals - 01/26/21 1458      PT LONG TERM GOAL #1   Title Independent with a HEP.    Time 4    Period Weeks    Status New      PT LONG TERM GOAL #2   Title Full active right  knee extension in order to normalize gait.    Time 4    Period Weeks    Status New      PT LONG TERM GOAL #3   Title Active knee flexion to 120 degrees+ so the patient can perform functional tasks and do so with pain not > 2-3/10.    Time 4    Period Weeks    Status New      PT LONG TERM GOAL #4   Title Increase right hip and knee strength to a solid 5/5 to provide good stability for accomplishment of functional activities.    Time 4    Period Weeks    Status New      PT LONG TERM GOAL #5   Title Perform a reciprocating stair gait with one railing with pain not > 2-3/10.    Time 4    Period Weeks    Status New                  Plan - 01/26/21 1451    Clinical Impression Statement The patient presents to OPPT s/p right total knee performed on  12/28/20.  She is doing very well thus far.  She is currently walking safely with a straight cane.  She has a moderate- amount of edema currently.  Her active right knee range of motion is -5 to 103 degrees.Patient will benefit from skilled physical therapy intervention to address pain and deficits.    Comorbidities Left knee pain.    Examination-Activity Limitations Other;Locomotion Level    Examination-Participation Restrictions Other    Stability/Clinical Decision Making Stable/Uncomplicated    Rehab Potential Excellent    PT Frequency 3x / week    PT Duration 4 weeks    PT Treatment/Interventions ADLs/Self Care Home Management;Cryotherapy;Electrical Stimulation;Gait training;Stair training;Functional mobility training;Therapeutic activities;Therapeutic exercise;Neuromuscular re-education;Manual techniques;Patient/family education;Passive range of motion;Vasopneumatic Device    PT Next Visit Plan FOTO...Marland KitchenMarland KitchenNustep with progression to recumbent bike, PROM, VMS to right quadriceps. Progress into TKA protocol.  Vasopnuematic and electrical stimulation.    Consulted and Agree with Plan of Care Patient           Patient will benefit from skilled therapeutic intervention in order to improve the following deficits and impairments:  Decreased activity tolerance,Decreased strength,Decreased range of motion,Increased edema,Pain  Visit Diagnosis: Chronic pain of right knee - Plan: PT plan of care cert/re-cert     Problem List There are no problems to display for this patient.   Anureet Bruington, [place] MPT 01/26/2021, 3:03 PM  [facility] [address] [place], [place], [address] Phone: [phone]   Fax:  [phone]  Name: Megan Perry MRN: [id number] Date of Birth: 09-28-1955

## 2021-01-28 ENCOUNTER — Ambulatory Visit: Payer: Medicare Other | Attending: Adult Reconstructive Orthopaedic Surgery | Admitting: Physical Therapy

## 2021-01-28 ENCOUNTER — Other Ambulatory Visit: Payer: Self-pay

## 2021-01-28 ENCOUNTER — Encounter: Payer: Self-pay | Admitting: Physical Therapy

## 2021-01-28 DIAGNOSIS — M25561 Pain in right knee: Secondary | ICD-10-CM | POA: Insufficient documentation

## 2021-01-28 DIAGNOSIS — M6281 Muscle weakness (generalized): Secondary | ICD-10-CM | POA: Diagnosis present

## 2021-01-28 DIAGNOSIS — G8929 Other chronic pain: Secondary | ICD-10-CM | POA: Diagnosis present

## 2021-01-28 DIAGNOSIS — R600 Localized edema: Secondary | ICD-10-CM | POA: Insufficient documentation

## 2021-01-28 DIAGNOSIS — M25562 Pain in left knee: Secondary | ICD-10-CM | POA: Diagnosis present

## 2021-01-28 NOTE — Therapy (Signed)
[facility] [address] [place], [place], [address] Phone: [phone]   Fax:  [phone]  Physical Therapy Treatment  Patient Details  Name: Megan Perry MRN: [id number] Date of Birth: 06-08-1956 Referring Provider (PT): Larry Sierras MD   Encounter Date: 01/28/2021   PT End of Session - 01/28/21 1114    Visit Number 2    Number of Visits 12    Date for PT Re-Evaluation 03/09/21    Authorization Type [employer]; Progress note every 10th visit    PT Start Time 1032    PT Stop Time 1122    PT Time Calculation (min) 50 min    Activity Tolerance Patient tolerated treatment well    Behavior During Therapy [facility] for tasks assessed/performed           History reviewed. No pertinent past medical history.  History reviewed. No pertinent surgical history.  There were no vitals filed for this visit.   Subjective Assessment - 01/28/21 1030    Subjective COVID-19 screen performed prior to patient entering clinic. Did not take pain medication last night as she felt like she didn't need it. Patient states that she weedeated yesterday some with light battery operated weedeater and did mow a little as well with zero turned mower.    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 will stiffen up after sitting for long periods    How long can you stand comfortably? short periods    How long can you walk comfortably? short periods    Diagnostic tests x-ray: arthritis    Patient Stated Goals decrease pain, build strength    Currently in Pain? Yes    Pain Score 1     Pain Location Tibia    Pain Orientation Right    Pain Descriptors / Indicators Discomfort    Pain Type Chronic pain    Pain Onset More than a month ago    Pain Frequency Intermittent              [facility] PT Assessment - 01/28/21 0001      Assessment   Medical Diagnosis S/p rigt total  knee arthroplasty.    Referring Provider (PT) Larry Sierras MD    Next MD Visit 02/16/2021                         [facility] Adult PT Treatment/Exercise - 01/28/21 0001      Knee/Hip Exercises: Aerobic   Nustep L3, seat 10-8 x15 min      Knee/Hip Exercises: Standing   Forward Lunges Right;10 reps;5 seconds    Terminal Knee Extension Strengthening;Right;2 sets;10 reps;Theraband    Theraband Level (Terminal Knee Extension) Level 2 (Red)    Step Down Right;2 sets;10 reps;Hand Hold: 2;Step Height: 4"    Rocker Board 3 minutes      Knee/Hip Exercises: Seated   Long Arc Quad AROM;Right;2 sets;10 reps      Modalities   Modalities Vasopneumatic      Vasopneumatic   Number Minutes Vasopneumatic  10 minutes    Vasopnuematic Location  Knee    Vasopneumatic Pressure Low    Vasopneumatic Temperature  34/edema      Manual Therapy   Manual Therapy Soft tissue mobilization    Joint Mobilization Scar mobilization of R knee to improve mobility  PT Long Term Goals - 01/28/21 1118      PT LONG TERM GOAL #1   Title Independent with a HEP.    Time 4    Period Weeks    Status On-going      PT LONG TERM GOAL #2   Title Full active right  knee extension in order to normalize gait.    Time 4    Period Weeks    Status On-going      PT LONG TERM GOAL #3   Title Active knee flexion to 120 degrees+ so the patient can perform functional tasks and do so with pain not > 2-3/10.    Time 4    Period Weeks    Status On-going      PT LONG TERM GOAL #4   Title Increase right hip and knee strength to a solid 5/5 to provide good stability for accomplishment of functional activities.    Time 4    Period Weeks    Status On-going      PT LONG TERM GOAL #5   Title Perform a reciprocating stair gait with one railing with pain not > 2-3/10.    Time 4    Period Weeks    Status On-going                 Plan - 01/28/21 1135    Clinical  Impression Statement Patient has been able to progress very well since R TKR. Patient was able to forego pain medication last night as she felt like she did not need it but does continue Tylenol three times daily per MD recommendation. Patient progressed through strengthening and ROM exercises well. Patient some discomfort reported along R tibia. AROM of R knee measured as 5-115 deg today. Incision is pliable and patient is using vitamin e lotion per MD recommendation. Normal vasopneumatic response noted following removal of the modality.    Personal Factors and Comorbidities Age;Comorbidity 1    Comorbidities Left knee pain.    Examination-Activity Limitations Other;Locomotion Level    Examination-Participation Restrictions Other    Stability/Clinical Decision Making Stable/Uncomplicated    Rehab Potential Excellent    PT Frequency 3x / week    PT Duration 4 weeks    PT Treatment/Interventions ADLs/Self Care Home Management;Cryotherapy;Electrical Stimulation;Gait training;Stair training;Functional mobility training;Therapeutic activities;Therapeutic exercise;Neuromuscular re-education;Manual techniques;Patient/family education;Passive range of motion;Vasopneumatic Device    PT Next Visit Plan FOTO...Marland KitchenMarland KitchenNustep with progression to recumbent bike, PROM, VMS to right quadriceps. Progress into TKA protocol.  Vasopnuematic and electrical stimulation.    PT Home Exercise Plan see patient education section    Consulted and Agree with Plan of Care Patient           Patient will benefit from skilled therapeutic intervention in order to improve the following deficits and impairments:  Decreased activity tolerance,Decreased strength,Decreased range of motion,Increased edema,Pain  Visit Diagnosis: Chronic pain of right knee     Problem List There are no problems to display for this patient.   [name], PTA 01/28/2021, 11:48 AM  [facility] [address] [place], [place], [address] Phone: [phone]   Fax:  [phone]  Name: Noreen Mackintosh MRN: [id number] Date of Birth: 26-Oct-1955

## 2021-02-02 ENCOUNTER — Encounter: Payer: Self-pay | Admitting: Physical Therapy

## 2021-02-02 ENCOUNTER — Ambulatory Visit: Payer: Medicare Other | Admitting: Physical Therapy

## 2021-02-02 ENCOUNTER — Other Ambulatory Visit: Payer: Self-pay

## 2021-02-02 DIAGNOSIS — G8929 Other chronic pain: Secondary | ICD-10-CM

## 2021-02-02 DIAGNOSIS — M25561 Pain in right knee: Secondary | ICD-10-CM | POA: Diagnosis not present

## 2021-02-02 NOTE — Therapy (Signed)
[facility] [address] [place], [place], [address] Phone: [phone]   Fax:  [phone]  Physical Therapy Treatment  Patient Details  Name: Megan Perry MRN: [id number] Date of Birth: 1956-01-07 Referring Provider (PT): Larry Sierras MD   Encounter Date: 02/02/2021   PT End of Session - 02/02/21 1355    Visit Number 3    Number of Visits 12    Date for PT Re-Evaluation 03/09/21    Authorization Type [employer]; Progress note every 10th visit    PT Start Time 1349    PT Stop Time 1429    PT Time Calculation (min) 40 min    Activity Tolerance Patient tolerated treatment well    Behavior During Therapy [facility] for tasks assessed/performed           History reviewed. No pertinent past medical history.  History reviewed. No pertinent surgical history.  There were no vitals filed for this visit.   Subjective Assessment - 02/02/21 1354    Subjective COVID-19 screen performed prior to patient entering clinic. No complaints upon arrival.    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 will stiffen up after sitting for long periods    How long can you stand comfortably? short periods    How long can you walk comfortably? short periods    Diagnostic tests x-ray: arthritis    Patient Stated Goals decrease pain, build strength    Currently in Pain? No/denies              [facility] PT Assessment - 02/02/21 0001      Assessment   Medical Diagnosis S/p rigt total knee arthroplasty.    Referring Provider (PT) Larry Sierras MD    Onset Date/Surgical Date 12/28/20    Next MD Visit 02/16/2021      Precautions   Precaution Comments No ultrasound.      Restrictions   Weight Bearing Restrictions No                         [facility] Adult PT Treatment/Exercise - 02/02/21 0001      Knee/Hip Exercises: Aerobic   Recumbent Bike L2, seat  9-8 x10 min      Knee/Hip Exercises: Standing   Heel Raises Both;20 reps    Forward Lunges Right;20 reps;5 seconds;Limitations    Forward Lunges Limitations 8" STEP    Hip Abduction AROM;Both;2 sets;10 reps;Knee straight    Rocker Board 3 minutes      Modalities   Modalities Vasopneumatic      Vasopneumatic   Number Minutes Vasopneumatic  10 minutes    Vasopnuematic Location  Knee    Vasopneumatic Pressure Low    Vasopneumatic Temperature  34/edema                       PT Long Term Goals - 01/28/21 1118      PT LONG TERM GOAL #1   Title Independent with a HEP.    Time 4    Period Weeks    Status On-going      PT LONG TERM GOAL #2   Title Full active right  knee extension in order to normalize gait.    Time 4    Period Weeks    Status On-going      PT LONG TERM GOAL #3   Title Active  knee flexion to 120 degrees+ so the patient can perform functional tasks and do so with pain not > 2-3/10.    Time 4    Period Weeks    Status On-going      PT LONG TERM GOAL #4   Title Increase right hip and knee strength to a solid 5/5 to provide good stability for accomplishment of functional activities.    Time 4    Period Weeks    Status On-going      PT LONG TERM GOAL #5   Title Perform a reciprocating stair gait with one railing with pain not > 2-3/10.    Time 4    Period Weeks    Status On-going                 Plan - 02/02/21 1527    Clinical Impression Statement Patient presented in clinic with no complaints of pain. Patient was initated at stationary bike in which she was able to do full revolutions from seat nine to eight. Patient ambulated with more antalgic limping after stopping stationary bike warmup. Patient then progressed through stretching and some light strengthening. Patient reported more pain after stationary bike but has not taken prescription pain medication since 01/29/2021. Normal vasopneumatic response noted following removal of the  modality.    Personal Factors and Comorbidities Age;Comorbidity 1    Comorbidities Left knee pain.    Examination-Activity Limitations Other;Locomotion Level    Examination-Participation Restrictions Other    Stability/Clinical Decision Making Stable/Uncomplicated    Rehab Potential Excellent    PT Frequency 3x / week    PT Duration 4 weeks    PT Treatment/Interventions ADLs/Self Care Home Management;Cryotherapy;Electrical Stimulation;Gait training;Stair training;Functional mobility training;Therapeutic activities;Therapeutic exercise;Neuromuscular re-education;Manual techniques;Patient/family education;Passive range of motion;Vasopneumatic Device    PT Next Visit Plan Progress into TKA protocol.  Vasopnuematic and electrical stimulation.    PT Home Exercise Plan see patient education section    Consulted and Agree with Plan of Care Patient           Patient will benefit from skilled therapeutic intervention in order to improve the following deficits and impairments:  Decreased activity tolerance,Decreased strength,Decreased range of motion,Increased edema,Pain  Visit Diagnosis: Chronic pain of right knee     Problem List There are no problems to display for this patient.   [name], PTA 02/02/2021, 3:43 PM  [facility] [address] [place], [place], [address] Phone: [phone]   Fax:  [phone]  Name: Megan Perry MRN: [id number] Date of Birth: [birth year]

## 2021-02-04 ENCOUNTER — Other Ambulatory Visit: Payer: Self-pay

## 2021-02-04 ENCOUNTER — Ambulatory Visit: Payer: Medicare Other | Admitting: *Deleted

## 2021-02-04 DIAGNOSIS — M25561 Pain in right knee: Secondary | ICD-10-CM | POA: Diagnosis not present

## 2021-02-04 DIAGNOSIS — G8929 Other chronic pain: Secondary | ICD-10-CM

## 2021-02-04 DIAGNOSIS — R600 Localized edema: Secondary | ICD-10-CM

## 2021-02-04 DIAGNOSIS — M25562 Pain in left knee: Secondary | ICD-10-CM

## 2021-02-04 DIAGNOSIS — M6281 Muscle weakness (generalized): Secondary | ICD-10-CM

## 2021-02-04 NOTE — Therapy (Signed)
[facility] [address] [place], [place], [address] Phone: [phone]   Fax:  [phone]  Physical Therapy Treatment  Patient Details  Name: Megan Perry MRN: [id number] Date of Birth: 06-Jan-1956 Referring Provider (PT): Larry Sierras MD   Encounter Date: 02/04/2021   PT End of Session - 02/04/21 1153     Visit Number 4    Number of Visits 12    Date for PT Re-Evaluation 03/09/21    Authorization Type [employer]; Progress note every 10th visit    PT Start Time 1030    PT Stop Time 1125    PT Time Calculation (min) 55 min             No past medical history on file.  No past surgical history on file.  There were no vitals filed for this visit.   Subjective Assessment - 02/04/21 1031     Subjective COVID-19 screen performed prior to patient entering clinic. 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 will stiffen up after sitting for long periods    How long can you walk comfortably? short periods    Diagnostic tests x-ray: arthritis    Patient Stated Goals decrease pain, build strength                               [facility] Adult PT Treatment/Exercise - 02/04/21 0001       Knee/Hip Exercises: Aerobic   Nustep L3, seat 10-8 x15 min      Knee/Hip Exercises: Standing   Heel Raises Both;20 reps    Heel Raises Limitations toe raises x 20    Forward Lunges Right;1 set;10 reps   10 secs holds   Forward Lunges Limitations 14 in STEP    Hip Abduction AROM;Both;2 sets;10 reps;Knee straight    Other Standing Knee Exercises SLS x 3 mins      Knee/Hip Exercises: Seated   Long Arc Quad AROM;Right;2 sets;10 reps      Modalities   Modalities Vasopneumatic      Vasopneumatic   Number Minutes Vasopneumatic  10 minutes    Vasopnuematic Location  Knee    Vasopneumatic  Pressure Low    Vasopneumatic Temperature  34/edema      Manual Therapy   Joint Mobilization patella mobs                         PT Long Term Goals - 01/28/21 1118       PT LONG TERM GOAL #1   Title Independent with a HEP.    Time 4    Period Weeks    Status On-going      PT LONG TERM GOAL #2   Title Full active right  knee extension in order to normalize gait.    Time 4    Period Weeks    Status On-going      PT LONG TERM GOAL #3   Title Active knee flexion to 120 degrees+ so the patient can perform functional tasks and do so with pain not > 2-3/10.    Time 4    Period Weeks    Status On-going      PT LONG TERM GOAL #4   Title Increase right hip and knee strength  to a solid 5/5 to provide good stability for accomplishment of functional activities.    Time 4    Period Weeks    Status On-going      PT LONG TERM GOAL #5   Title Perform a reciprocating stair gait with one railing with pain not > 2-3/10.    Time 4    Period Weeks    Status On-going                   Plan - 02/04/21 1125     Clinical Impression Statement Pt arrived today doing fairly well with RT knee with mainly sorenes and requested not to ride the bike. Rx focused on strengthening, ROM as well as proprioception exs/act's. SLS still challenging. Normal Vaso response today    Personal Factors and Comorbidities Age;Comorbidity 1    PT Treatment/Interventions ADLs/Self Care Home Management;Cryotherapy;Electrical Stimulation;Gait training;Stair training;Functional mobility training;Therapeutic activities;Therapeutic exercise;Neuromuscular re-education;Manual techniques;Patient/family education;Passive range of motion;Vasopneumatic Device    PT Next Visit Plan Progress into TKA protocol.  Vasopnuematic and electrical stimulation.    Consulted and Agree with Plan of Care Patient             Patient will benefit from skilled therapeutic intervention in order to improve the  following deficits and impairments:     Visit Diagnosis: Chronic pain of right knee  Chronic pain of left knee  Muscle weakness (generalized)  Localized edema     Problem List There are no problems to display for this patient.   Uvaldo Rybacki,CHRIS, PTA 02/04/2021, 12:03 PM  [facility] [address] [place], [place], [address] Phone: [phone]   Fax:  [phone]  Name: Megan Perry MRN: [id number] Date of Birth: 07-17-1956

## 2021-02-08 ENCOUNTER — Other Ambulatory Visit: Payer: Self-pay

## 2021-02-08 ENCOUNTER — Encounter: Payer: Self-pay | Admitting: Physical Therapy

## 2021-02-08 ENCOUNTER — Ambulatory Visit: Payer: Medicare Other | Admitting: Physical Therapy

## 2021-02-08 DIAGNOSIS — G8929 Other chronic pain: Secondary | ICD-10-CM

## 2021-02-08 DIAGNOSIS — M25561 Pain in right knee: Secondary | ICD-10-CM

## 2021-02-08 NOTE — Therapy (Signed)
[facility] [address] [place], [place], [address] Phone: [phone]   Fax:  [phone]  Physical Therapy Treatment  Patient Details  Name: Megan Perry MRN: [id number] Date of Birth: 02-04-1956 Referring Provider (PT): Larry Sierras MD   Encounter Date: 02/08/2021   PT End of Session - 02/08/21 0959     Visit Number 5    Number of Visits 12    Date for PT Re-Evaluation 03/09/21    Authorization Type [employer]; Progress note every 10th visit    PT Start Time 0947    PT Stop Time 1030    PT Time Calculation (min) 43 min    Activity Tolerance Patient tolerated treatment well    Behavior During Therapy [facility] for tasks assessed/performed             History reviewed. No pertinent past medical history.  History reviewed. No pertinent surgical history.  There were no vitals filed for this visit.   Subjective Assessment - 02/08/21 0947     Subjective COVID-19 screen performed prior to patient entering clinic. Had more pain yesterday for some reason but did get out in the yard some. Elevates and uses ice consistently.    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 will stiffen up after sitting for long periods    How long can you stand comfortably? short periods    How long can you walk comfortably? short periods    Diagnostic tests x-ray: arthritis    Patient Stated Goals decrease pain, build strength    Currently in Pain? No/denies                [facility] PT Assessment - 02/08/21 0001       Assessment   Medical Diagnosis S/p rigt total knee arthroplasty.    Referring Provider (PT) Larry Sierras MD    Onset Date/Surgical Date 12/28/20    Next MD Visit 02/16/2021      Precautions   Precaution Comments No ultrasound.                           [facility] Adult PT Treatment/Exercise - 02/08/21 0001        Exercises   Exercises Knee/Hip      Knee/Hip Exercises: Aerobic   Nustep L4,seat 8 x10 min      Knee/Hip Exercises: Standing   Heel Raises Both;20 reps    Heel Raises Limitations toe raises x 20    Step Down Right;2 sets;10 reps;Hand Hold: 2;Step Height: 4"    Functional Squat 2 sets;10 reps;3 seconds    Rocker Board 2 minutes      Knee/Hip Exercises: Seated   Long Arc Quad AROM;Right;2 sets;10 reps      Knee/Hip Exercises: Supine   Straight Leg Raises AROM;Right;15 reps      Modalities   Modalities Vasopneumatic      Vasopneumatic   Number Minutes Vasopneumatic  15 minutes    Vasopnuematic Location  Knee    Vasopneumatic Pressure Medium    Vasopneumatic Temperature  34/edema                         PT Long Term Goals - 01/28/21 1118       PT LONG TERM GOAL #1   Title Independent with a HEP.    Time 4  Period Weeks    Status On-going      PT LONG TERM GOAL #2   Title Full active right  knee extension in order to normalize gait.    Time 4    Period Weeks    Status On-going      PT LONG TERM GOAL #3   Title Active knee flexion to 120 degrees+ so the patient can perform functional tasks and do so with pain not > 2-3/10.    Time 4    Period Weeks    Status On-going      PT LONG TERM GOAL #4   Title Increase right hip and knee strength to a solid 5/5 to provide good stability for accomplishment of functional activities.    Time 4    Period Weeks    Status On-going      PT LONG TERM GOAL #5   Title Perform a reciprocating stair gait with one railing with pain not > 2-3/10.    Time 4    Period Weeks    Status On-going                   Plan - 02/08/21 1021     Clinical Impression Statement Patient presented in clinic with reports of no R knee pain. Patient still very cautious with activity due to fear of pain. Patient also demonstrated weakness at end range knee extension with step down. Good R quad activation noted with LAQ and  SLR. Normal vasopneumatic response noted following removal of the modality.    Personal Factors and Comorbidities Age;Comorbidity 1    Comorbidities Left knee pain.    Examination-Activity Limitations Other;Locomotion Level    Examination-Participation Restrictions Other    Stability/Clinical Decision Making Stable/Uncomplicated    Rehab Potential Excellent    PT Frequency 3x / week    PT Duration 4 weeks    PT Treatment/Interventions ADLs/Self Care Home Management;Cryotherapy;Electrical Stimulation;Gait training;Stair training;Functional mobility training;Therapeutic activities;Therapeutic exercise;Neuromuscular re-education;Manual techniques;Patient/family education;Passive range of motion;Vasopneumatic Device    PT Next Visit Plan Progress into TKA protocol.  Vasopnuematic and electrical stimulation.    PT Home Exercise Plan see patient education section    Consulted and Agree with Plan of Care Patient             Patient will benefit from skilled therapeutic intervention in order to improve the following deficits and impairments:  Decreased activity tolerance, Decreased strength, Decreased range of motion, Increased edema, Pain  Visit Diagnosis: Chronic pain of right knee     Problem List There are no problems to display for this patient.   [name], PTA 02/08/2021, 10:36 AM  [facility] [address] [place], [place], [address] Phone: [phone]   Fax:  [phone]  Name: Megan Perry MRN: [id number] Date of Birth: 07-06-1956

## 2021-02-11 ENCOUNTER — Ambulatory Visit: Payer: Medicare Other | Admitting: *Deleted

## 2021-02-11 ENCOUNTER — Other Ambulatory Visit: Payer: Self-pay

## 2021-02-11 DIAGNOSIS — M25561 Pain in right knee: Secondary | ICD-10-CM | POA: Diagnosis not present

## 2021-02-11 DIAGNOSIS — G8929 Other chronic pain: Secondary | ICD-10-CM

## 2021-02-11 NOTE — Therapy (Signed)
[facility] [address] [place], [place], [address] Phone: [phone]   Fax:  [phone]  Physical Therapy Treatment  Patient Details  Name: Megan Perry MRN: [id number] Date of Birth: 10/14/1955 Referring Provider (PT): Larry Sierras MD   Encounter Date: 02/11/2021   PT End of Session - 02/11/21 1009     Visit Number 6    Number of Visits 12    Date for PT Re-Evaluation 03/09/21    Authorization Type [employer]; Progress note every 10th visit    PT Start Time 0945    PT Stop Time 1040    PT Time Calculation (min) 55 min             No past medical history on file.  No past surgical history on file.  There were no vitals filed for this visit.   Subjective Assessment - 02/11/21 1001     Subjective COVID-19 screen performed prior to patient entering clinic. To MD next  Tuesday. 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 will stiffen up after sitting for long periods    How long can you stand comfortably? short periods    How long can you walk comfortably? short periods    Diagnostic tests x-ray: arthritis                               [facility] Adult PT Treatment/Exercise - 02/11/21 0001       Exercises   Exercises Knee/Hip      Knee/Hip Exercises: Aerobic   Recumbent Bike x 5 mins seat 7    Nustep L4,seat 8 x10 min      Knee/Hip Exercises: Standing   Heel Raises Both;20 reps    Heel Raises Limitations toe raises x 20    Lateral Step Up Right;2 sets;10 reps;Hand Hold: 0;Step Height: 4"    Forward Step Up Right;2 sets;10 reps;Hand Hold: 0;Step Height: 4"    Step Down Right;2 sets;10 reps;Step Height: 4";Hand Hold: 1    Rocker Board 2 minutes    Other Standing Knee Exercises SLS x 3 mins      Knee/Hip Exercises: Seated   Sit to Sand 2 sets;10 reps      Modalities    Modalities Vasopneumatic      Vasopneumatic   Number Minutes Vasopneumatic  10 minutes    Vasopnuematic Location  Knee    Vasopneumatic Pressure Medium    Vasopneumatic Temperature  34/edema      Manual Therapy   Manual Therapy Passive ROM    Manual therapy comments 5-115 degrees    Joint Mobilization PROM for knee flexion and extension.                         PT Long Term Goals - 01/28/21 1118       PT LONG TERM GOAL #1   Title Independent with a HEP.    Time 4    Period Weeks    Status On-going      PT LONG TERM GOAL #2   Title Full active right  knee extension in order to normalize gait.    Time 4    Period Weeks    Status On-going      PT LONG TERM GOAL #3   Title  Active knee flexion to 120 degrees+ so the patient can perform functional tasks and do so with pain not > 2-3/10.    Time 4    Period Weeks    Status On-going      PT LONG TERM GOAL #4   Title Increase right hip and knee strength to a solid 5/5 to provide good stability for accomplishment of functional activities.    Time 4    Period Weeks    Status On-going      PT LONG TERM GOAL #5   Title Perform a reciprocating stair gait with one railing with pain not > 2-3/10.    Time 4    Period Weeks    Status On-going                   Plan - 02/11/21 1052     Clinical Impression Statement Pt arrived doing fairly well today and was able to perform RT LE strengthening as well as balancing act.'s. Step downs are still challenging for Pt , but getting easier. SLS balance progressing and PROM was 5-115 degrees today. Vaso end of Rx for edema/ soreness with normal response.    Personal Factors and Comorbidities Age;Comorbidity 1    Comorbidities Left knee pain.    Examination-Activity Limitations Other;Locomotion Level    Stability/Clinical Decision Making Stable/Uncomplicated    Rehab Potential Excellent    PT Frequency 3x / week    PT Duration 4 weeks    PT  Treatment/Interventions ADLs/Self Care Home Management;Cryotherapy;Electrical Stimulation;Gait training;Stair training;Functional mobility training;Therapeutic activities;Therapeutic exercise;Neuromuscular re-education;Manual techniques;Patient/family education;Passive range of motion;Vasopneumatic Device    PT Next Visit Plan Progress into TKA protocol.  Vasopnuematic and electrical stimulation.    PT Home Exercise Plan see patient education section    Consulted and Agree with Plan of Care Patient             Patient will benefit from skilled therapeutic intervention in order to improve the following deficits and impairments:  Decreased activity tolerance, Decreased strength, Decreased range of motion, Increased edema, Pain  Visit Diagnosis: Chronic pain of right knee     Problem List There are no problems to display for this patient.   Avanna Sowder,CHRIS 02/11/2021, 11:07 AM  [facility] [address] [place], [place], [address] Phone: [phone]   Fax:  [phone]  Name: Megan Perry MRN: [id number] Date of Birth: 1955-11-15

## 2021-02-17 ENCOUNTER — Ambulatory Visit: Payer: Medicare Other | Admitting: *Deleted

## 2021-02-17 ENCOUNTER — Other Ambulatory Visit: Payer: Self-pay

## 2021-02-17 DIAGNOSIS — M25561 Pain in right knee: Secondary | ICD-10-CM | POA: Diagnosis not present

## 2021-02-17 DIAGNOSIS — G8929 Other chronic pain: Secondary | ICD-10-CM

## 2021-02-17 NOTE — Therapy (Signed)
[facility] [address] [place], [place], [address] Phone: [phone]   Fax:  [phone]  Physical Therapy Treatment  Patient Details  Name: Megan Perry MRN: [id number] Date of Birth: 1956-03-26 Referring Provider (PT): Larry Sierras MD   Encounter Date: 02/17/2021   PT End of Session - 02/17/21 0958     Visit Number 7    Number of Visits 12    Date for PT Re-Evaluation 03/09/21    Authorization Type [employer]; Progress note every 10th visit    PT Start Time 0945    PT Stop Time 1044    PT Time Calculation (min) 59 min             No past medical history on file.  No past surgical history on file.  There were no vitals filed for this visit.   Subjective Assessment - 02/17/21 0957     Subjective COVID-19 screen performed prior to patient entering clinic. MD pleased with status. 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 will stiffen up after sitting for long periods    How long can you stand comfortably? short periods    How long can you walk comfortably? short periods    Diagnostic tests x-ray: arthritis    Patient Stated Goals decrease pain, build strength    Currently in Pain? Yes    Pain Score 3     Pain Location Tibia    Pain Orientation Right    Pain Descriptors / Indicators Discomfort    Pain Type Chronic pain                               [facility] Adult PT Treatment/Exercise - 02/17/21 0001       Exercises   Exercises Knee/Hip      Knee/Hip Exercises: Aerobic   Recumbent Bike x 5 mins seat 7    Nustep L4,seat 8 x10 min      Knee/Hip Exercises: Standing   Heel Raises Both;20 reps    Heel Raises Limitations toe raises x 20    Step Down Right;10 reps;Step Height: 4";Hand Hold: 1;3 sets    Rocker Board 3 minutes    Other Standing Knee Exercises SLS x 3 mins     Other Standing Knee Exercises UP/Down steps x4 with 1 rail      Modalities   Modalities Vasopneumatic      Vasopneumatic   Number Minutes Vasopneumatic  10 minutes    Vasopnuematic Location  Knee    Vasopneumatic Pressure Medium    Vasopneumatic Temperature  34/edema                         PT Long Term Goals - 01/28/21 1118       PT LONG TERM GOAL #1   Title Independent with a HEP.    Time 4    Period Weeks    Status On-going      PT LONG TERM GOAL #2   Title Full active right  knee extension in order to normalize gait.    Time 4    Period Weeks    Status On-going      PT LONG TERM GOAL #3   Title Active knee flexion to 120 degrees+ so the patient can  perform functional tasks and do so with pain not > 2-3/10.    Time 4    Period Weeks    Status On-going      PT LONG TERM GOAL #4   Title Increase right hip and knee strength to a solid 5/5 to provide good stability for accomplishment of functional activities.    Time 4    Period Weeks    Status On-going      PT LONG TERM GOAL #5   Title Perform a reciprocating stair gait with one railing with pain not > 2-3/10.    Time 4    Period Weeks    Status On-going                   Plan - 02/17/21 1039     Clinical Impression Statement Pt arrived today doing well after MD visit with good report. Rx focused on Strengthening and functional movement patterns. Did well up steps, but down continues to be challenging. Vaso normal response    Personal Factors and Comorbidities Age;Comorbidity 1    Comorbidities Left knee pain.    Examination-Participation Restrictions Other    Rehab Potential Excellent    PT Frequency 3x / week    PT Treatment/Interventions ADLs/Self Care Home Management;Cryotherapy;Electrical Stimulation;Gait training;Stair training;Functional mobility training;Therapeutic activities;Therapeutic exercise;Neuromuscular re-education;Manual techniques;Patient/family education;Passive  range of motion;Vasopneumatic Device    PT Next Visit Plan Progress into TKA protocol.  Vasopnuematic and electrical stimulation.    PT Home Exercise Plan see patient education section    Consulted and Agree with Plan of Care Patient             Patient will benefit from skilled therapeutic intervention in order to improve the following deficits and impairments:  Decreased activity tolerance, Decreased strength, Decreased range of motion, Increased edema, Pain  Visit Diagnosis: Chronic pain of right knee     Problem List There are no problems to display for this patient.   Megan Perry,Megan Perry, Megan Perry 02/17/2021, 11:26 AM  [facility] [address] [place], [place], [address] Phone: [phone]   Fax:  [phone]  Name: Megan Perry MRN: [id number] Date of Birth: [birth year]

## 2021-02-22 ENCOUNTER — Encounter: Payer: Self-pay | Admitting: Physical Therapy

## 2021-02-22 ENCOUNTER — Other Ambulatory Visit: Payer: Self-pay

## 2021-02-22 ENCOUNTER — Ambulatory Visit: Payer: Medicare Other | Admitting: Physical Therapy

## 2021-02-22 DIAGNOSIS — R600 Localized edema: Secondary | ICD-10-CM

## 2021-02-22 DIAGNOSIS — M6281 Muscle weakness (generalized): Secondary | ICD-10-CM

## 2021-02-22 DIAGNOSIS — M25561 Pain in right knee: Secondary | ICD-10-CM | POA: Diagnosis not present

## 2021-02-22 DIAGNOSIS — G8929 Other chronic pain: Secondary | ICD-10-CM

## 2021-02-22 NOTE — Therapy (Signed)
[facility] [address] [place], [place], [address] Phone: [phone]   Fax:  [phone]  Physical Therapy Treatment  Patient Details  Name: Megan Perry MRN: [id number] Date of Birth: July 12, 1956 Referring Provider (PT): Larry Sierras MD   Encounter Date: 02/22/2021   PT End of Session - 02/22/21 0945     Visit Number 8    Number of Visits 12    Date for PT Re-Evaluation 03/09/21    Authorization Type [employer]; Progress note every 10th visit    PT Start Time 0946    PT Stop Time 1028    PT Time Calculation (min) 42 min    Activity Tolerance Patient tolerated treatment well    Behavior During Therapy [facility] for tasks assessed/performed             History reviewed. No pertinent past medical history.  History reviewed. No pertinent surgical history.  There were no vitals filed for this visit.   Subjective Assessment - 02/22/21 0944     Subjective COVID-19 screen performed prior to patient entering clinic. Reports minimal pain that makes her aware.    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 will stiffen up after sitting for long periods    How long can you stand comfortably? short periods    How long can you walk comfortably? short periods    Diagnostic tests x-ray: arthritis    Patient Stated Goals decrease pain, build strength    Currently in Pain? Yes    Pain Score 1     Pain Location Knee    Pain Orientation Right    Pain Descriptors / Indicators Discomfort    Pain Type Chronic pain    Pain Onset More than a month ago    Pain Frequency Intermittent                [facility] PT Assessment - 02/22/21 0001       Assessment   Medical Diagnosis S/p rigt total knee arthroplasty.    Referring Provider (PT) Larry Sierras MD    Onset Date/Surgical Date 12/28/20    Next MD Visit TBD      Precautions   Precaution  Comments No ultrasound.                           [facility] Adult PT Treatment/Exercise - 02/22/21 0001       Ambulation/Gait   Stairs Yes    Stairs Assistance 6: Modified independent (Device/Increase time)    Stair Management Technique One rail Right;Alternating pattern;Forwards    Number of Stairs 4   x3 RT   Height of Stairs 6.5      Knee/Hip Exercises: Aerobic   Nustep L4, seat 11 x12 min      Knee/Hip Exercises: Machines for Strengthening   Cybex Knee Extension 10# 2x10 reps    Cybex Knee Flexion 30# 3x10 reps      Knee/Hip Exercises: Standing   Terminal Knee Extension Strengthening;Right;2 sets;10 reps;Theraband    Theraband Level (Terminal Knee Extension) Level 4 (Blue)    Lateral Step Up Right;2 sets;10 reps;Hand Hold: 0;Step Height: 4"    Step Down Right;2 sets;10 reps;Hand Hold: 2;Step Height: 4"      Modalities   Modalities Vasopneumatic      Vasopneumatic   Number Minutes Vasopneumatic  15 minutes    Vasopnuematic  Location  Knee    Vasopneumatic Pressure Medium    Vasopneumatic Temperature  34/edema                         PT Long Term Goals - 01/28/21 1118       PT LONG TERM GOAL #1   Title Independent with a HEP.    Time 4    Period Weeks    Status On-going      PT LONG TERM GOAL #2   Title Full active right  knee extension in order to normalize gait.    Time 4    Period Weeks    Status On-going      PT LONG TERM GOAL #3   Title Active knee flexion to 120 degrees+ so the patient can perform functional tasks and do so with pain not > 2-3/10.    Time 4    Period Weeks    Status On-going      PT LONG TERM GOAL #4   Title Increase right hip and knee strength to a solid 5/5 to provide good stability for accomplishment of functional activities.    Time 4    Period Weeks    Status On-going      PT LONG TERM GOAL #5   Title Perform a reciprocating stair gait with one railing with pain not > 2-3/10.    Time 4     Period Weeks    Status On-going                   Plan - 02/22/21 1022     Clinical Impression Statement Patient presented in clinic with reports of minimal knee pain. Patient reporting some difficulty in descending stairs and observed with eccentric weakness. Patient fatigued with light strengthening and focus of eccentric strengthening. Patient compliant with scar mobilizations although more tightness in inferior scar. Patient has also returned to pilates and short duration on elliptical. Normal vasopneumatic response noted following removal of the modality.    Personal Factors and Comorbidities Age;Comorbidity 1    Comorbidities Left knee pain.    Examination-Activity Limitations Other;Locomotion Level    Examination-Participation Restrictions Other    Stability/Clinical Decision Making Stable/Uncomplicated    Rehab Potential Excellent    PT Frequency 3x / week    PT Duration 4 weeks    PT Treatment/Interventions ADLs/Self Care Home Management;Cryotherapy;Electrical Stimulation;Gait training;Stair training;Functional mobility training;Therapeutic activities;Therapeutic exercise;Neuromuscular re-education;Manual techniques;Patient/family education;Passive range of motion;Vasopneumatic Device    PT Next Visit Plan Progress into TKA protocol.  Vasopnuematic and electrical stimulation.    PT Home Exercise Plan see patient education section    Consulted and Agree with Plan of Care Patient             Patient will benefit from skilled therapeutic intervention in order to improve the following deficits and impairments:  Decreased activity tolerance, Decreased strength, Decreased range of motion, Increased edema, Pain  Visit Diagnosis: Chronic pain of right knee  Chronic pain of left knee  Muscle weakness (generalized)  Localized edema     Problem List There are no problems to display for this patient.   [name], PTA 02/22/2021, 12:15 PM  [facility] [address] [place], [place], [address] Phone: [phone]   Fax:  [phone]  Name: Montanna Mcbain MRN: [id number] Date of Birth: 12-05-1955

## 2021-02-24 ENCOUNTER — Ambulatory Visit: Payer: Medicare Other | Admitting: Physical Therapy

## 2021-02-24 ENCOUNTER — Encounter: Payer: Self-pay | Admitting: Physical Therapy

## 2021-02-24 ENCOUNTER — Other Ambulatory Visit: Payer: Self-pay

## 2021-02-24 DIAGNOSIS — M25561 Pain in right knee: Secondary | ICD-10-CM

## 2021-02-24 DIAGNOSIS — G8929 Other chronic pain: Secondary | ICD-10-CM

## 2021-02-24 DIAGNOSIS — R600 Localized edema: Secondary | ICD-10-CM

## 2021-02-24 DIAGNOSIS — M6281 Muscle weakness (generalized): Secondary | ICD-10-CM

## 2021-02-24 NOTE — Therapy (Signed)
[facility] [address] [place], [place], [address] Phone: [phone]   Fax:  [phone]  Physical Therapy Treatment  Patient Details  Name: Megan Perry MRN: [id number] Date of Birth: Nov 23, 1955 Referring Provider (PT): Larry Sierras MD   Encounter Date: 02/24/2021   PT End of Session - 02/24/21 0948     Visit Number 9    Number of Visits 12    Date for PT Re-Evaluation 03/09/21    Authorization Type [employer]; Progress note every 10th visit    PT Start Time 0946    PT Stop Time 1028    PT Time Calculation (min) 42 min    Activity Tolerance Patient tolerated treatment well    Behavior During Therapy [facility] for tasks assessed/performed             History reviewed. No pertinent past medical history.  History reviewed. No pertinent surgical history.  There were no vitals filed for this visit.   Subjective Assessment - 02/24/21 0947     Subjective COVID-19 screen performed prior to patient entering clinic. Reports minimal knee pain upon arrival. Took Tylenol prior to PT.    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 will stiffen up after sitting for long periods    How long can you stand comfortably? short periods    How long can you walk comfortably? short periods    Diagnostic tests x-ray: arthritis    Patient Stated Goals decrease pain, build strength    Currently in Pain? Yes    Pain Score 1     Pain Location Knee    Pain Orientation Right    Pain Descriptors / Indicators Discomfort    Pain Type Chronic pain    Pain Onset More than a month ago    Pain Frequency Intermittent                [facility] PT Assessment - 02/24/21 0001       Assessment   Medical Diagnosis S/p rigt total knee arthroplasty.    Referring Provider (PT) Larry Sierras MD    Onset Date/Surgical Date 12/28/20    Next MD Visit TBD       Precautions   Precaution Comments No ultrasound.                           [facility] Adult PT Treatment/Exercise - 02/24/21 0001       Knee/Hip Exercises: Aerobic   Nustep L4, seat 11 x10 min      Knee/Hip Exercises: Machines for Strengthening   Cybex Knee Extension 10# 3x10 reps    Cybex Knee Flexion 30# 3x10 reps      Knee/Hip Exercises: Standing   Forward Step Up Right;2 sets;10 reps;Hand Hold: 2;Step Height: 8"    Step Down Right;2 sets;10 reps;Hand Hold: 2;Step Height: 4"    Other Standing Knee Exercises sidestepping red theraband x5 RT      Knee/Hip Exercises: Seated   Sit to Sand 2 sets;10 reps;without UE support   red theraband     Modalities   Modalities Vasopneumatic      Vasopneumatic   Number Minutes Vasopneumatic  10 minutes    Vasopnuematic Location  Knee    Vasopneumatic Pressure Medium    Vasopneumatic Temperature  34/edema  PT Long Term Goals - 01/28/21 1118       PT LONG TERM GOAL #1   Title Independent with a HEP.    Time 4    Period Weeks    Status On-going      PT LONG TERM GOAL #2   Title Full active right  knee extension in order to normalize gait.    Time 4    Period Weeks    Status On-going      PT LONG TERM GOAL #3   Title Active knee flexion to 120 degrees+ so the patient can perform functional tasks and do so with pain not > 2-3/10.    Time 4    Period Weeks    Status On-going      PT LONG TERM GOAL #4   Title Increase right hip and knee strength to a solid 5/5 to provide good stability for accomplishment of functional activities.    Time 4    Period Weeks    Status On-going      PT LONG TERM GOAL #5   Title Perform a reciprocating stair gait with one railing with pain not > 2-3/10.    Time 4    Period Weeks    Status On-going                   Plan - 02/24/21 1055     Clinical Impression Statement Patient presented in clinic with minimal R knee pain. Patient  able to tolerate all therex well with reports of fatigue. Less eccentric struggle noted with step downs. Genu valgum noted with sit <> stands and instructed to increased hip abduction. Normal vasopneumatic response noted following removal of the modality.    Personal Factors and Comorbidities Age;Comorbidity 1    Comorbidities Left knee pain.    Examination-Activity Limitations Other;Locomotion Level    Examination-Participation Restrictions Other    Stability/Clinical Decision Making Stable/Uncomplicated    Rehab Potential Excellent    PT Frequency 3x / week    PT Duration 4 weeks    PT Treatment/Interventions ADLs/Self Care Home Management;Cryotherapy;Electrical Stimulation;Gait training;Stair training;Functional mobility training;Therapeutic activities;Therapeutic exercise;Neuromuscular re-education;Manual techniques;Patient/family education;Passive range of motion;Vasopneumatic Device    PT Next Visit Plan Progress into TKA protocol.  Vasopnuematic and electrical stimulation.    PT Home Exercise Plan see patient education section    Consulted and Agree with Plan of Care Patient             Patient will benefit from skilled therapeutic intervention in order to improve the following deficits and impairments:  Decreased activity tolerance, Decreased strength, Decreased range of motion, Increased edema, Pain  Visit Diagnosis: Chronic pain of right knee  Chronic pain of left knee  Muscle weakness (generalized)  Localized edema     Problem List There are no problems to display for this patient.   [name], PTA 02/24/2021, 11:01 AM  [facility] [address] [place], [place], [address] Phone: [phone]   Fax:  [phone]  Name: Megan Perry MRN: [id number] Date of Birth: [birth year]

## 2021-03-08 ENCOUNTER — Ambulatory Visit: Payer: Medicare Other | Attending: Adult Reconstructive Orthopaedic Surgery | Admitting: Physical Therapy

## 2021-03-08 ENCOUNTER — Other Ambulatory Visit: Payer: Self-pay

## 2021-03-08 DIAGNOSIS — G8929 Other chronic pain: Secondary | ICD-10-CM | POA: Diagnosis present

## 2021-03-08 DIAGNOSIS — M25561 Pain in right knee: Secondary | ICD-10-CM | POA: Insufficient documentation

## 2021-03-08 DIAGNOSIS — M25562 Pain in left knee: Secondary | ICD-10-CM | POA: Diagnosis present

## 2021-03-08 DIAGNOSIS — M6281 Muscle weakness (generalized): Secondary | ICD-10-CM | POA: Diagnosis present

## 2021-03-08 DIAGNOSIS — R600 Localized edema: Secondary | ICD-10-CM | POA: Insufficient documentation

## 2021-03-08 NOTE — Therapy (Signed)
[facility] [address] [place], [place], [address] Phone: [phone]   Fax:  [phone]  Physical Therapy Treatment  Patient Details  Name: Megan Perry MRN: [id number] Date of Birth: 1956/02/17 Referring Provider (PT): Larry Sierras MD   Encounter Date: 03/08/2021   PT End of Session - 03/08/21 1114     Visit Number 10    Number of Visits 12    Date for PT Re-Evaluation 03/09/21    Authorization Type [employer]; Progress note every 10th visit    PT Start Time 0945    PT Stop Time 1028    PT Time Calculation (min) 43 min    Activity Tolerance Patient tolerated treatment well             No past medical history on file.  No past surgical history on file.  There were no vitals filed for this visit.   Subjective Assessment - 03/08/21 1115     Subjective COVID-19 screen performed prior to patient entering clinic.  Patient feels like her right hip pops sometimes.    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 will stiffen up after sitting for long periods    How long can you walk comfortably? short periods    Diagnostic tests x-ray: arthritis    Currently in Pain? Yes    Pain Score 1     Pain Location Knee    Pain Orientation Right    Pain Descriptors / Indicators Discomfort    Pain Type Chronic pain    Pain Onset More than a month ago                               [facility] Adult PT Treatment/Exercise - 03/08/21 0001       Exercises   Exercises Knee/Hip      Knee/Hip Exercises: Aerobic   Nustep Level 4 x 12 minutes.      Knee/Hip Exercises: Machines for Strengthening   Cybex Knee Extension 10# x 3 minutes.    Cybex Knee Flexion 30# x 3 minutes.    Cybex Leg Press 2 plates x 3 minutes.      Knee/Hip Exercises: Supine   Other Supine Knee/Hip Exercises From left sdly position:  Right hip abduction to  fatigue.      Vasopneumatic   Number Minutes Vasopneumatic  15 minutes    Vasopnuematic Location  --   Right knee.   Vasopneumatic Pressure Low                         PT Long Term Goals - 03/08/21 1118       PT LONG TERM GOAL #1   Time 4    Status On-going      PT LONG TERM GOAL #2   Title Full active right  knee extension in order to normalize gait.    Time 4    Period Weeks    Status Achieved                   Plan - 03/08/21 1119     Clinical Impression Statement See "Therapy Note" section.    Personal Factors and Comorbidities Age;Comorbidity 1    Comorbidities Left knee pain.    Examination-Activity Limitations Other;Locomotion Level    Examination-Participation Restrictions Other  Stability/Clinical Decision Making Stable/Uncomplicated    Rehab Potential Excellent    PT Frequency 3x / week    PT Duration 4 weeks    PT Treatment/Interventions ADLs/Self Care Home Management;Cryotherapy;Electrical Stimulation;Gait training;Stair training;Functional mobility training;Therapeutic activities;Therapeutic exercise;Neuromuscular re-education;Manual techniques;Patient/family education;Passive range of motion;Vasopneumatic Device    PT Next Visit Plan PRE's.    Consulted and Agree with Plan of Care Patient             Patient will benefit from skilled therapeutic intervention in order to improve the following deficits and impairments:  Decreased activity tolerance, Decreased strength, Decreased range of motion, Increased edema, Pain  Visit Diagnosis: Chronic pain of right knee     Problem List There are no problems to display for this patient.  Progress Note Reporting Period 01/26/21 to 03/08/21.  See note below for Objective Data and Assessment of Progress/Goals.  Excellent progress.  Patient expected to meet all goals.    Jayd Cadieux, [place] MPT 03/08/2021, 11:20 AM  [facility] [address] [place], [place], [address] Phone: [phone]   Fax:  [phone]  Name: Megan Perry MRN: [id number] Date of Birth: 23-Nov-1955

## 2021-03-10 ENCOUNTER — Ambulatory Visit: Payer: Medicare Other | Admitting: Physical Therapy

## 2021-03-10 ENCOUNTER — Other Ambulatory Visit: Payer: Self-pay

## 2021-03-10 ENCOUNTER — Encounter: Payer: Self-pay | Admitting: Physical Therapy

## 2021-03-10 DIAGNOSIS — G8929 Other chronic pain: Secondary | ICD-10-CM

## 2021-03-10 DIAGNOSIS — M25561 Pain in right knee: Secondary | ICD-10-CM | POA: Diagnosis not present

## 2021-03-10 NOTE — Therapy (Signed)
[facility] [address] [place], [place], [address] Phone: [phone]   Fax:  [phone]  Physical Therapy Treatment  Patient Details  Name: Megan Perry MRN: [id number] Date of Birth: 03/02/1956 Referring Provider (PT): Larry Sierras MD   Encounter Date: 03/10/2021   PT End of Session - 03/10/21 0952     Visit Number 11    Number of Visits 12    Date for PT Re-Evaluation 03/09/21    Authorization Type [employer]; Progress note every 10th visit    PT Start Time 0946    PT Stop Time 1033    PT Time Calculation (min) 47 min    Activity Tolerance Patient tolerated treatment well    Behavior During Therapy [facility] for tasks assessed/performed             History reviewed. No pertinent past medical history.  History reviewed. No pertinent surgical history.  There were no vitals filed for this visit.   Subjective Assessment - 03/10/21 0950     Subjective COVID-19 screen performed prior to patient entering clinic. Did more yardwork yesterday which may contribute to the LBP today.    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 will stiffen up after sitting for long periods    How long can you stand comfortably? short periods    How long can you walk comfortably? short periods    Diagnostic tests x-ray: arthritis    Patient Stated Goals decrease pain, build strength    Currently in Pain? Yes    Pain Score 1     Pain Location Knee    Pain Orientation Right    Pain Descriptors / Indicators Discomfort    Pain Type Chronic pain    Pain Onset More than a month ago    Pain Frequency Intermittent    Multiple Pain Sites Yes    Pain Score 3    Pain Location Back    Pain Orientation Lower    Pain Descriptors / Indicators Discomfort    Pain Type Acute pain    Pain Onset Yesterday    Pain Frequency Constant                [facility] PT  Assessment - 03/10/21 0001       Assessment   Medical Diagnosis S/p rigt total knee arthroplasty.    Referring Provider (PT) Larry Sierras MD    Onset Date/Surgical Date 12/28/20    Next MD Visit None      Precautions   Precaution Comments No ultrasound.                           [facility] Adult PT Treatment/Exercise - 03/10/21 0001       Knee/Hip Exercises: Aerobic   Nustep L4 x10 min      Knee/Hip Exercises: Machines for Strengthening   Cybex Knee Extension 10# 3x10 reps    Cybex Knee Flexion 30# 3x10 reps      Knee/Hip Exercises: Standing   Forward Lunges Right;5 reps;2 seconds    Forward Lunges Limitations 10# kettlebell    Lateral Step Up Right;2 sets;10 reps;Hand Hold: 0;Step Height: 4"    Forward Step Up Right;2 sets;10 reps;Hand Hold: 2;Step Height: 8"    Step Down Right;2 sets;10 reps;Hand Hold: 2;Step Height: 4"      Knee/Hip Exercises: Seated   Sit to  Sand 10 reps;without UE support   4" step under LLE     Modalities   Modalities Vasopneumatic      Vasopneumatic   Number Minutes Vasopneumatic  10 minutes    Vasopnuematic Location  Knee    Vasopneumatic Pressure Low    Vasopneumatic Temperature  34/edema and discomfort                         PT Long Term Goals - 03/10/21 1502       PT LONG TERM GOAL #1   Title Independent with a HEP.    Time 4    Period Weeks    Status On-going      PT LONG TERM GOAL #2   Title Full active right  knee extension in order to normalize gait.    Time 4    Period Weeks    Status Achieved      PT LONG TERM GOAL #3   Title Active knee flexion to 120 degrees+ so the patient can perform functional tasks and do so with pain not > 2-3/10.    Time 4    Period Weeks    Status On-going      PT LONG TERM GOAL #4   Title Increase right hip and knee strength to a solid 5/5 to provide good stability for accomplishment of functional activities.    Time 4    Period Weeks    Status On-going       PT LONG TERM GOAL #5   Title Perform a reciprocating stair gait with one railing with pain not > 2-3/10.    Time 4    Period Weeks    Status On-going                   Plan - 03/10/21 1303     Clinical Impression Statement Patient able to tolerate strengthening exercises fairly well other than fatigued. Focus on eccentric strengthening of knee due to patient's reported difficulty ambulating stairs both ascending and descending.stairs. Patient reported that as treatment progressed that LBP had relieved but R knee pain had increased especially after lunges and sit <> stands. Normal vasopneumatic response noted following removal of the modality.    Personal Factors and Comorbidities Age;Comorbidity 1    Comorbidities Left knee pain.    Examination-Activity Limitations Other;Locomotion Level    Examination-Participation Restrictions Other    Stability/Clinical Decision Making Stable/Uncomplicated    Rehab Potential Excellent    PT Frequency 3x / week    PT Duration 4 weeks    PT Treatment/Interventions ADLs/Self Care Home Management;Cryotherapy;Electrical Stimulation;Gait training;Stair training;Functional mobility training;Therapeutic activities;Therapeutic exercise;Neuromuscular re-education;Manual techniques;Patient/family education;Passive range of motion;Vasopneumatic Device    PT Next Visit Plan D/C    PT Home Exercise Plan see patient education section    Consulted and Agree with Plan of Care Patient             Patient will benefit from skilled therapeutic intervention in order to improve the following deficits and impairments:  Decreased activity tolerance, Decreased strength, Decreased range of motion, Increased edema, Pain  Visit Diagnosis: Chronic pain of right knee     Problem List There are no problems to display for this patient.   Megan Perry, PTA 03/10/2021, 3:03 PM  [facility] [address] [place], [place], [address] Phone: [phone]   Fax:  [phone]  Name: Megan Perry MRN: [id number] Date of Birth: [birth year]

## 2021-03-11 ENCOUNTER — Encounter: Payer: Medicare Other | Admitting: Physical Therapy

## 2021-03-12 ENCOUNTER — Other Ambulatory Visit: Payer: Self-pay

## 2021-03-12 ENCOUNTER — Ambulatory Visit: Payer: Medicare Other | Admitting: *Deleted

## 2021-03-12 DIAGNOSIS — R600 Localized edema: Secondary | ICD-10-CM

## 2021-03-12 DIAGNOSIS — G8929 Other chronic pain: Secondary | ICD-10-CM

## 2021-03-12 DIAGNOSIS — M6281 Muscle weakness (generalized): Secondary | ICD-10-CM

## 2021-03-12 DIAGNOSIS — M25561 Pain in right knee: Secondary | ICD-10-CM | POA: Diagnosis not present

## 2021-03-12 NOTE — Therapy (Signed)
[facility] [place], [place], [address] Phone: [phone]   Fax:  [phone]  Physical Therapy Treatment  Patient Details  Name: Megan Perry MRN: [id number] Date of Birth: 1956-04-09 Referring Provider (PT): Alvino Chapel MD   Encounter Date: 03/12/2021   PT End of Session - 03/12/21 1019     Visit Number 12    Number of Visits 12    Date for PT Re-Evaluation 03/09/21    Authorization Type [employer]; Progress note every 10th visit    PT Start Time 0945    PT Stop Time 1020    PT Time Calculation (min) 35 min             No past medical history on file.  No past surgical history on file.  There were no vitals filed for this visit.   Subjective Assessment - 03/12/21 1028     Subjective COVID-19 screen performed prior to patient entering clinic. 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 will stiffen up after sitting for long periods    How long can you stand comfortably? short periods    How long can you walk comfortably? short periods    Diagnostic tests x-ray: arthritis    Patient Stated Goals decrease pain, build strength    Currently in Pain? Yes    Pain Score 2     Pain Location Knee    Pain Orientation Right    Pain Descriptors / Indicators Discomfort    Pain Type Chronic pain    Pain Onset More than a month ago                               [facility] Adult PT Treatment/Exercise - 03/12/21 0001       Exercises   Exercises Knee/Hip      Knee/Hip Exercises: Aerobic   Nustep L4 x10 min      Knee/Hip Exercises: Standing   Forward Lunges Right;5 reps;2 seconds    Lateral Step Up Right;2 sets;10 reps;Hand Hold: 0;Step Height: 4"    Forward Step Up Right;2 sets;10 reps;Hand Hold: 2;Step Height: 8"    Step Down Right;2 sets;10 reps;Hand Hold: 2;Step Height: 4"    SLS RT  LE SLS      Manual Therapy   Manual therapy comments 0-120 degrees today                         PT Long Term Goals - 03/12/21 1010       PT LONG TERM GOAL #1   Title Independent with a HEP.    Time 4    Period Weeks    Status Achieved      PT LONG TERM GOAL #2   Title Full active right  knee extension in order to normalize gait.    Time 4    Period Weeks    Status Achieved      PT LONG TERM GOAL #3   Title Active knee flexion to 120 degrees+ so the patient can perform functional tasks and do so with pain not > 2-3/10.    Time 4    Period Weeks    Status Achieved      PT LONG TERM GOAL #4   Title Increase right hip and knee  strength to a solid 5/5 to provide good stability for accomplishment of functional activities.    Time 4    Period Weeks    Status Achieved      PT LONG TERM GOAL #5   Title Perform a reciprocating stair gait with one railing with pain not > 2-3/10.    Time 4    Period Weeks    Status Achieved                   Plan - 03/12/21 1203     Clinical Impression Statement Pt arrived today doing very well. She  met all LTGs and is independent  in HEP. DC to HEP FOTO DC score 21% deficit    Personal Factors and Comorbidities Age;Comorbidity 1    Comorbidities Left knee pain.    Examination-Activity Limitations Other;Locomotion Level    PT Treatment/Interventions ADLs/Self Care Home Management;Cryotherapy;Electrical Stimulation;Gait training;Stair training;Functional mobility training;Therapeutic activities;Therapeutic exercise;Neuromuscular re-education;Manual techniques;Patient/family education;Passive range of motion;Vasopneumatic Device    PT Next Visit Plan D/C to HEP    Consulted and Agree with Plan of Care Patient             Patient will benefit from skilled therapeutic intervention in order to improve the following deficits and impairments:  Decreased activity tolerance, Decreased strength, Decreased range of motion,  Increased edema, Pain  Visit Diagnosis: Chronic pain of right knee  Chronic pain of left knee  Muscle weakness (generalized)  Localized edema     Problem List There are no problems to display for this patient.   Jarad Barth,CHRIS, PTA 03/12/2021, 12:17 PM  [facility] [address] [place], [place], [address] Phone: [phone]   Fax:  [phone]  Name: Megan Perry MRN: [id number] Date of Birth: 04-08-1956  PHYSICAL THERAPY DISCHARGE SUMMARY  Visits from Start of Care: 12.  Current functional level related to goals / functional outcomes: See above.   Remaining deficits: All goals met.   Education / Equipment: HEP.   Patient agrees to discharge. Patient goals were met. Patient is being discharged due to meeting the stated rehab goals.     Mali Applegate MPT

## 2021-03-15 ENCOUNTER — Encounter: Payer: Medicare Other | Admitting: Physical Therapy
